# Patient Record
Sex: Male | Born: 1945 | Race: White | Hispanic: No | Marital: Married | Smoking: Never smoker
Health system: Southern US, Community
[De-identification: ages and names within clinical notes are randomized; demographics above are authoritative.]

## PROBLEM LIST (undated history)

## (undated) DIAGNOSIS — K219 Gastro-esophageal reflux disease without esophagitis: Secondary | ICD-10-CM

## (undated) DIAGNOSIS — J189 Pneumonia, unspecified organism: Secondary | ICD-10-CM

## (undated) DIAGNOSIS — E785 Hyperlipidemia, unspecified: Secondary | ICD-10-CM

## (undated) DIAGNOSIS — I10 Essential (primary) hypertension: Secondary | ICD-10-CM

## (undated) DIAGNOSIS — E119 Type 2 diabetes mellitus without complications: Secondary | ICD-10-CM

## (undated) HISTORY — PX: CARDIAC CATHETERIZATION: SHX172

---

## 2000-09-01 ENCOUNTER — Ambulatory Visit (HOSPITAL_COMMUNITY): Admission: RE | Admit: 2000-09-01 | Discharge: 2000-09-01 | Payer: Self-pay | Admitting: Internal Medicine

## 2001-02-23 ENCOUNTER — Encounter: Payer: Self-pay | Admitting: Family Medicine

## 2001-02-23 ENCOUNTER — Ambulatory Visit (HOSPITAL_COMMUNITY): Admission: RE | Admit: 2001-02-23 | Discharge: 2001-02-23 | Payer: Self-pay | Admitting: Family Medicine

## 2001-03-05 ENCOUNTER — Encounter: Admission: RE | Admit: 2001-03-05 | Discharge: 2001-03-05 | Payer: Self-pay | Admitting: Family Medicine

## 2001-03-05 ENCOUNTER — Encounter: Payer: Self-pay | Admitting: Family Medicine

## 2003-08-21 ENCOUNTER — Ambulatory Visit (HOSPITAL_COMMUNITY)
Admission: RE | Admit: 2003-08-21 | Discharge: 2003-08-21 | Payer: Self-pay | Admitting: Rehabilitative and Restorative Service Providers"

## 2004-02-12 ENCOUNTER — Ambulatory Visit (HOSPITAL_COMMUNITY): Admission: RE | Admit: 2004-02-12 | Discharge: 2004-02-12 | Payer: Self-pay | Admitting: *Deleted

## 2005-04-02 ENCOUNTER — Ambulatory Visit (HOSPITAL_COMMUNITY): Admission: RE | Admit: 2005-04-02 | Discharge: 2005-04-02 | Payer: Self-pay | Admitting: *Deleted

## 2007-03-24 ENCOUNTER — Ambulatory Visit (HOSPITAL_COMMUNITY): Admission: RE | Admit: 2007-03-24 | Discharge: 2007-03-24 | Payer: Self-pay | Admitting: *Deleted

## 2008-03-20 HISTORY — PX: DOPPLER ECHOCARDIOGRAPHY: SHX263

## 2008-03-24 ENCOUNTER — Ambulatory Visit (HOSPITAL_COMMUNITY): Admission: RE | Admit: 2008-03-24 | Discharge: 2008-03-24 | Payer: Self-pay | Admitting: Cardiovascular Disease

## 2008-05-16 ENCOUNTER — Ambulatory Visit (HOSPITAL_COMMUNITY): Admission: RE | Admit: 2008-05-16 | Discharge: 2008-05-16 | Payer: Self-pay | Admitting: Family Medicine

## 2009-03-27 ENCOUNTER — Ambulatory Visit (HOSPITAL_COMMUNITY): Admission: RE | Admit: 2009-03-27 | Discharge: 2009-03-27 | Payer: Self-pay | Admitting: Cardiovascular Disease

## 2009-04-13 ENCOUNTER — Ambulatory Visit: Payer: Self-pay | Admitting: Cardiothoracic Surgery

## 2010-03-12 ENCOUNTER — Ambulatory Visit (HOSPITAL_COMMUNITY)
Admission: RE | Admit: 2010-03-12 | Discharge: 2010-03-12 | Payer: Self-pay | Source: Home / Self Care | Attending: Cardiovascular Disease | Admitting: Cardiovascular Disease

## 2010-03-31 ENCOUNTER — Encounter: Payer: Self-pay | Admitting: *Deleted

## 2010-05-02 ENCOUNTER — Encounter (INDEPENDENT_AMBULATORY_CARE_PROVIDER_SITE_OTHER): Payer: BC Managed Care – PPO | Admitting: Cardiothoracic Surgery

## 2010-05-02 DIAGNOSIS — I712 Thoracic aortic aneurysm, without rupture: Secondary | ICD-10-CM

## 2010-05-03 NOTE — Assessment & Plan Note (Signed)
OFFICE VISIT  Brian Gray, Brian Gray DOB:  August 10, 1945                                        May 02, 2010 CHART #:  [id number]  The patient returns to the office today in followup after being seen in February of last year at the request of Dr. Elonda Husky.  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  He had been asymptomatic and has no known aortic valvular disease.  There is no family history of early dissections or aneurysms.  The patient returns today with a followup CT scan 1 year later.  Since last seen, he has been diligent about control of his blood pressure and ranges around 110/75 usually.  He has had no chest pain, exertional shortness of breath, or evidence of congestive heart failure.  PHYSICAL EXAMINATION:  On exam today, his blood pressure 147/94, pulse is 80, respiratory rate is 20, O2 sats 96%, he weighs 200 pounds, 6 feet tall. He has no carotid bruits.  Lungs are clear bilaterally.  Cardiac exam reveals a regular rate and rhythm.  He has no murmur of aortic insufficiency.  On abdominal exam, he has no palpable abdominal aneurysm appreciated.  He has no pedal edema, has 2+ DP and PT pulses bilaterally.  CT scan is reviewed and discussed with the patient and his wife.  He does have a very mildly dilated ascending aorta, maximum dimension 4.3 cm, not changed in appearance.  He does have some scattered coronary calcifications, but the CT scan is not detailed enough to evaluate coronary artery disease.  At this point, the patient has no indication for replacement of his ascending aorta.  I have discussed with him the signs and symptoms of coronary disease.  He continues to now be followed by Dr. Royann Shivers at [facility], since who has seen very little change documented by CT scan over the past year and previous impression that this has been present for  6-7 years.  I have discussed with the patient and followup scan in 2 years rather than 1 year to decrease his radiation dose.  He is agreeable with this.  I have also discussed with him the need to have very diligent blood pressure control.  He continues on the Crestor 20 mg a day, enalapril 10 mg a day, hydrochlorothiazide 12.5 a day, aspirin 81 mg a day, metoprolol 12.5 mg a day, potassium, vitamin B, and cinnamon.  He notes that his blood pressure had been low and one of his medications had recently been decreased.  Overall, appears stable, confirmed on CT scan.  We will plan to see him back in 2 years with a followup CT.  Sheliah Plane, MD Electronically Signed  EG/MEDQ  D:  05/02/2010  T:  05/03/2010  Job:  [id number]  cc:   Mila Homer. Sudie Bailey, M.D. Thurmon Fair, MD

## 2010-05-20 LAB — CREATININE, SERUM: GFR calc non Af Amer: >60 mL/min (ref >60)

## 2010-07-23 NOTE — Consult Note (Signed)
NEW PATIENT CONSULTATION   [name], [name]  DOB:  [birth year]                                        April 13, 2009  CHART #:  [id number]   REQUESTING PHYSICIAN:  Brian Iba, MD   FOLLOWUP CARDIOLOGIST:  Brian Iba, MD   PRIMARY CARE PHYSICIAN:  Brian Homer. Sudie Bailey, MD   REASON FOR CONSULTATION:  Dilated ascending aorta.   HISTORY OF PRESENT ILLNESS:  The patient is a 65 year old male who gives  a history of being followed by Dr. 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  He notes that this has  been known about for 4-5 years.  He recently saw Dr. Mariah [name], who  suggested that he make contact with thoracic surgeon to further discuss  the situation.  The patient is asymptomatic from a cardiac standpoint.  He denies chest pain.  He denies shortness of breath.  He notes that he  did have a stress test 1 year ago.  He has had no syncope, no  presyncope.  There is no family history of early death from aneurysm.  He has had no previous history of myocardial infarction.  Cardiac risk  factors; he does have a history of hypertension, history of  hyperlipidemia, and questionable diabetes.  He has never smoked.  He  denies history of stroke.   PAST MEDICAL HISTORY:  Significant for renal cyst.   PAST SURGICAL HISTORY:  He has had no previous surgery.   SOCIAL HISTORY:  The patient is married, retired from [employer] this  past year.  Does not use alcohol.   FAMILY HISTORY:  Significant for his father died in his 67s of  alcoholism, mother is alive at 104.  He has 4 brothers, one 34 recently  had prostate surgery.  He has 3 brothers, do have a history of diabetes,  coronary stents, and 1 brother had bypass surgery.   CURRENT MEDICATIONS:  1. Hydrochlorothiazide 12.5 mg daily.  2. Enalapril 10 mg daily.  3. Aspirin 81 mg daily.  4. Crestor 20 mg a day.  5. Metoprolol ER 25 daily.  6. B12 1000 mcg a day.  7.  Potassium.  8. Cosamin DS 2 times a week.   DRUG ALLERGIES:  None known.   REVIEW OF SYSTEMS:  CARDIAC REVIEW OF SYSTEMS:  Negative for chest pain,  resting shortness of breath, exertional shortness of breath, orthopnea,  presyncope, syncope, palpitation, or lower extremity edema.  GENERAL REVIEW OF SYSTEMS:  The patient denies fever, chills, or night  sweats.  There is no change in weight.  Denies blood in his stool or  urine.  He did note numbness in his left foot in the past.  It was  treated with B12 and he says this has totally resolved.  No history of  seizures.  No psychiatric history.  No history of polyuria or  polydipsia.  Other review of systems are negative.   PHYSICAL EXAMINATION:  General:  The patient appears his stated age.  Awake, alert, and neurologically intact.  Vital Signs:  His blood  pressure 110/70, pulse 99, respiratory rate is 18, and O2 sats 95%.  He  is 6 feet tall, 190 pounds.  Neck:  He has no carotid bruits.  Lungs:  Clear to auscultation bilaterally without wheezing.  Cardiac:  A regular  rate and rhythm without murmur or gallop.  Abdomen:  Benign without  palpable masses.  Extremities:  Lower extremities with 2+ DP and PT  pulses bilaterally.  Radial pulses are present bilaterally.  Specifically, I do not feel any popliteal aneurysm or abdominal  aneurysm.   CT scan of the chest is reviewed.  The patient has a mildly dilated  ascending aorta approximately 4.2 cm.  This may be a very slight  increase from the 4.1-4.2 in the past 2 years ago.  The patient also has  a large left upper pole renal cyst, that is unchanged.   IMPRESSION:  The patient with mildly dilated ascending aorta without  evidence of aortic valve pathology.  I have reviewed with the patient  and his wife the risks and options of surgical intervention at this time  without valvular heart disease, requiring surgical intervention be more  likely the aortic size of approximately 5.5 to  be considering elective  replacement of his ascending aorta.  I have reviewed with him the  importance of good blood pressure control including with beta-blocker  and I have made a followup appointment for him to see me in 1 year with  a followup CT scan.  I have also reviewed with him the signs and  symptoms of aortic dissection.   Brian Plane, MD  Electronically Signed   EG/MEDQ  D:  04/13/2009  T:  04/14/2009  Job:  [id number]   cc:   Brian Iba, MD  Brian Homer. Sudie [name], M.D.

## 2011-01-16 ENCOUNTER — Encounter (INDEPENDENT_AMBULATORY_CARE_PROVIDER_SITE_OTHER): Payer: Self-pay | Admitting: *Deleted

## 2011-02-04 HISTORY — PX: NM MYOVIEW LTD: HXRAD82

## 2011-02-18 ENCOUNTER — Ambulatory Visit (HOSPITAL_COMMUNITY)
Admission: RE | Admit: 2011-02-18 | Discharge: 2011-02-18 | Disposition: A | Discharge status: Home / Self Care | Payer: Medicare Other | Source: Ambulatory Visit | Attending: Cardiovascular Disease | Admitting: Cardiovascular Disease

## 2011-02-18 ENCOUNTER — Encounter (HOSPITAL_COMMUNITY): Payer: Self-pay

## 2011-02-18 ENCOUNTER — Other Ambulatory Visit (HOSPITAL_COMMUNITY): Payer: Self-pay | Admitting: Cardiovascular Disease

## 2011-02-18 DIAGNOSIS — Z01811 Encounter for preprocedural respiratory examination: Secondary | ICD-10-CM

## 2011-02-18 DIAGNOSIS — Z01818 Encounter for other preprocedural examination: Secondary | ICD-10-CM | POA: Insufficient documentation

## 2011-02-18 DIAGNOSIS — R079 Chest pain, unspecified: Secondary | ICD-10-CM | POA: Insufficient documentation

## 2011-02-20 ENCOUNTER — Other Ambulatory Visit: Payer: Self-pay | Admitting: Cardiovascular Disease

## 2011-02-21 ENCOUNTER — Encounter (HOSPITAL_COMMUNITY)
Admission: RE | Disposition: A | Discharge status: Home / Self Care | Payer: Self-pay | Source: Ambulatory Visit | Attending: Cardiovascular Disease

## 2011-02-21 ENCOUNTER — Other Ambulatory Visit: Payer: Self-pay

## 2011-02-21 ENCOUNTER — Ambulatory Visit (HOSPITAL_COMMUNITY)
Admission: RE | Admit: 2011-02-21 | Discharge: 2011-02-21 | Disposition: A | Discharge status: Home / Self Care | Payer: Medicare Other | Source: Ambulatory Visit | Attending: Cardiovascular Disease | Admitting: Cardiovascular Disease

## 2011-02-21 DIAGNOSIS — R079 Chest pain, unspecified: Secondary | ICD-10-CM | POA: Insufficient documentation

## 2011-02-21 HISTORY — PX: CARDIAC CATHETERIZATION: SHX172

## 2011-02-21 HISTORY — PX: LEFT HEART CATHETERIZATION WITH CORONARY ANGIOGRAM: SHX5451

## 2011-02-21 SURGERY — LEFT HEART CATHETERIZATION WITH CORONARY ANGIOGRAM
Anesthesia: LOCAL

## 2011-02-21 MED ORDER — FENTANYL CITRATE 0.05 MG/ML IJ SOLN
INTRAMUSCULAR | Status: AC
  Filled 2011-02-21: qty 2

## 2011-02-21 MED ORDER — SODIUM CHLORIDE 0.9 % IJ SOLN: 3.0000 mL | INTRAMUSCULAR | Status: DC | PRN

## 2011-02-21 MED ORDER — SODIUM CHLORIDE 0.9 % IV SOLN: INTRAVENOUS | Status: DC

## 2011-02-21 MED ORDER — LIDOCAINE HCL (PF) 1 % IJ SOLN
INTRAMUSCULAR | Status: AC
  Filled 2011-02-21: qty 30

## 2011-02-21 MED ORDER — HEPARIN (PORCINE) IN NACL 2-0.9 UNIT/ML-% IJ SOLN
INTRAMUSCULAR | Status: AC
  Filled 2011-02-21: qty 2000

## 2011-02-21 MED ORDER — VERAPAMIL HCL 2.5 MG/ML IV SOLN
INTRAVENOUS | Status: AC
  Filled 2011-02-21: qty 2

## 2011-02-21 MED ORDER — OXYCODONE-ACETAMINOPHEN 5-325 MG PO TABS: 1.0000 | ORAL_TABLET | ORAL | Status: DC | PRN

## 2011-02-21 MED ORDER — MIDAZOLAM HCL 2 MG/2ML IJ SOLN
INTRAMUSCULAR | Status: AC
  Filled 2011-02-21: qty 2

## 2011-02-21 MED ORDER — SODIUM CHLORIDE 0.9 % IV SOLN: 1.0000 mL/kg/h | INTRAVENOUS | Status: DC

## 2011-02-21 MED ORDER — ONDANSETRON HCL 4 MG/2ML IJ SOLN: 4.0000 mg | Freq: Four times a day (QID) | INTRAMUSCULAR | Status: DC | PRN

## 2011-02-21 MED ORDER — ACETAMINOPHEN 325 MG PO TABS: 650.0000 mg | ORAL_TABLET | ORAL | Status: DC | PRN

## 2011-02-21 MED ORDER — NITROGLYCERIN 0.2 MG/ML ON CALL CATH LAB
INTRAVENOUS | Status: AC
  Filled 2011-02-21: qty 1

## 2011-02-21 MED ORDER — HEPARIN SODIUM (PORCINE) 1000 UNIT/ML IJ SOLN
INTRAMUSCULAR | Status: AC
  Filled 2011-02-21: qty 1

## 2011-02-21 NOTE — Progress Notes (Signed)
Explained D/C instructions to pt and wife of pt.  They both verbalized understanding of D/C instructions. Pt D/C home with wife via wheelchair

## 2011-02-21 NOTE — H&P (Signed)
Date of Initial H&P: 02/18/11 History reviewed, patient examined, no change in status, stable for surgery.

## 2011-02-21 NOTE — Op Note (Signed)
CARDIAC CATHETERIZATION REPORT   Procedures performed:  1. Left heart catheterization  2. Selective coronary angiography    Reason for procedure:   Abnormal nuclear stress test Unexplained atypical chest pain  Procedure performed by: Thurmon Fair, MD, [facility]  Complications: none   Estimated blood loss: less than 5 mL   History:  Atypical chest pain. Abnormal nuclear scan suggests inferior ischemia.  Consent: The risks, benefits, and details of the procedure were explained to the patient. Risks including death, MI, stroke, bleeding, limb ischemia, renal failure and allergy were described and accepted by the patient. Informed written consent was obtained prior to proceeding.  Technique: The patient was brought to the cardiac catheterization laboratory in the fasting state. He was prepped and draped in the usual sterile fashion. Local anesthesia with 1% lidocaine was administered to the right wrist area. Using the modified Seldinger technique a 5 French right radial artery sheath was introduced without difficulty. Under fluoroscopic guidance, using 5 [place] TIG catheter, selective cannulation of the left coronary artery, right coronary artery and left ventricle were respectively performed. Several coronary angiograms in a variety of projections were recorded. Left ventricular pressure and a pull back to the aorta were recorded. No immediate complications occurred. At the end of the procedure, the catheter were removed. After the procedure, hemostasis will be achieved with radial band pressure.  Contrast used: 65 mL Omnipaque  Angiographic Findings:  1. The left main coronary artery is free of significant atherosclerosis and bifurcates in the usual fashion into the left anterior descending artery and left circumflex coronary artery.  2. The left anterior descending artery is a large vessel that reaches the apex and generates 1 major diagonal branch. There is evidence of no luminal  irregularities and no calcification. No hemodynamically meaningful stenoses are seen. 3. The left circumflex coronary artery is a medium-size vessel non dominant vessel that generates 2 major oblque marginal arteries (one is very proximal, like a ramus intermedius). There is evidence of no luminal irregularities and no calcification. No hemodynamically meaningful stenoses are seen. 4. The right coronary artery is a large-size dominant vessel that generates a large posterior lateral ventricular system as well as the PDA. There is evidence of no luminal irregularities and minimal proximal calcification. No hemodynamically meaningful stenoses are seen.  5. The left ventricle was not injected. The left ventricular end-diastolic pressure is 2 mm Hg. There is no aortic stenosis by pullback.    IMPRESSIONS:  Normal coronary arteries. False positive nuclear stress test. RECOMMENDATION:  Evaluate for non cardiac cause of symptoms.

## 2011-03-13 ENCOUNTER — Encounter (INDEPENDENT_AMBULATORY_CARE_PROVIDER_SITE_OTHER): Payer: Self-pay | Admitting: *Deleted

## 2011-03-26 ENCOUNTER — Telehealth (INDEPENDENT_AMBULATORY_CARE_PROVIDER_SITE_OTHER): Payer: Self-pay | Admitting: *Deleted

## 2011-03-26 ENCOUNTER — Other Ambulatory Visit (INDEPENDENT_AMBULATORY_CARE_PROVIDER_SITE_OTHER): Payer: Self-pay | Admitting: *Deleted

## 2011-03-26 DIAGNOSIS — Z1211 Encounter for screening for malignant neoplasm of colon: Secondary | ICD-10-CM

## 2011-03-26 MED ORDER — PEG-KCL-NACL-NASULF-NA ASC-C 100 G PO SOLR: 1.0000 | Freq: Once | ORAL | Status: DC

## 2011-03-26 NOTE — Telephone Encounter (Signed)
Patient needs movi prep 

## 2011-04-22 ENCOUNTER — Telehealth (INDEPENDENT_AMBULATORY_CARE_PROVIDER_SITE_OTHER): Payer: Self-pay | Admitting: *Deleted

## 2011-04-22 NOTE — Telephone Encounter (Signed)
agree

## 2011-04-22 NOTE — Telephone Encounter (Signed)
Requesting MD/PCP:  [facility]     Name & DOB: Brian Gray     Procedure: tcs  Reason/Indication:  screening  Has patient had this procedure before?  yes  If so, when, by whom and where?  10-11 yrs ago  Is there a family history of colon cancer?  no  Who?  What age when diagnosed?    Is patient diabetic?   no      Does patient have prosthetic heart valve?  no  Do you have a pacemaker?  no  Has patient had joint replacement within last 12 months?  no  Is patient on Coumadin, Plavix and/or Aspirin? yes  Medications: asa 81 mg daily, hctz 12.5 mg daily, enalapril 10 mg daily,, b-12 1000 mg daily, cinnamon 1000 mg daily, crestor 20 mg daily, metoprolol 12.5 mg daily  Allergies: nkda  Pharmacy: [facility]  Medication Adjustment: asa 2 days  Procedure date & time: 05/15/11 at 930

## 2011-05-13 ENCOUNTER — Encounter (HOSPITAL_COMMUNITY): Payer: Self-pay | Admitting: Pharmacy Technician

## 2011-05-14 MED ORDER — SODIUM CHLORIDE 0.45 % IV SOLN
Freq: Once | INTRAVENOUS | Status: AC
  Administered 2011-05-15: 09:00:00 via INTRAVENOUS

## 2011-05-15 ENCOUNTER — Encounter (HOSPITAL_COMMUNITY)
Admission: RE | Disposition: A | Discharge status: Home Health | Payer: Self-pay | Source: Ambulatory Visit | Attending: Internal Medicine

## 2011-05-15 ENCOUNTER — Ambulatory Visit (HOSPITAL_COMMUNITY)
Admission: RE | Admit: 2011-05-15 | Discharge: 2011-05-15 | Disposition: A | Discharge status: Home Health | Payer: Medicare Other | Source: Ambulatory Visit | Attending: Internal Medicine | Admitting: Internal Medicine

## 2011-05-15 ENCOUNTER — Encounter (HOSPITAL_COMMUNITY): Payer: Self-pay | Admitting: *Deleted

## 2011-05-15 DIAGNOSIS — I10 Essential (primary) hypertension: Secondary | ICD-10-CM | POA: Insufficient documentation

## 2011-05-15 DIAGNOSIS — Z79899 Other long term (current) drug therapy: Secondary | ICD-10-CM | POA: Insufficient documentation

## 2011-05-15 DIAGNOSIS — K5730 Diverticulosis of large intestine without perforation or abscess without bleeding: Secondary | ICD-10-CM

## 2011-05-15 DIAGNOSIS — Z1211 Encounter for screening for malignant neoplasm of colon: Secondary | ICD-10-CM | POA: Insufficient documentation

## 2011-05-15 DIAGNOSIS — K644 Residual hemorrhoidal skin tags: Secondary | ICD-10-CM

## 2011-05-15 DIAGNOSIS — E785 Hyperlipidemia, unspecified: Secondary | ICD-10-CM | POA: Insufficient documentation

## 2011-05-15 HISTORY — DX: Hyperlipidemia, unspecified: E78.5

## 2011-05-15 HISTORY — PX: COLONOSCOPY: SHX5424

## 2011-05-15 HISTORY — DX: Essential (primary) hypertension: I10

## 2011-05-15 SURGERY — COLONOSCOPY
Anesthesia: Moderate Sedation

## 2011-05-15 MED ORDER — MIDAZOLAM HCL 5 MG/5ML IJ SOLN
INTRAMUSCULAR | Status: DC | PRN
  Administered 2011-05-15: 2 mg via INTRAVENOUS
  Administered 2011-05-15: 1 mg via INTRAVENOUS
  Administered 2011-05-15: 2 mg via INTRAVENOUS

## 2011-05-15 MED ORDER — STERILE WATER FOR IRRIGATION IR SOLN
Status: DC | PRN
  Administered 2011-05-15: 10:00:00

## 2011-05-15 MED ORDER — MEPERIDINE HCL 50 MG/ML IJ SOLN
INTRAMUSCULAR | Status: AC
  Filled 2011-05-15: qty 1

## 2011-05-15 MED ORDER — MIDAZOLAM HCL 5 MG/5ML IJ SOLN
INTRAMUSCULAR | Status: AC
  Filled 2011-05-15: qty 10

## 2011-05-15 MED ORDER — MEPERIDINE HCL 50 MG/ML IJ SOLN
INTRAMUSCULAR | Status: DC | PRN
  Administered 2011-05-15 (×2): 25 mg via INTRAVENOUS

## 2011-05-15 NOTE — Discharge Instructions (Addendum)
Resume usual medications. Take Naprosyn only when you really need it. High-fiber diet. No driving for [id number]. Next screening exam in 10 years.  Colonoscopy Care After These instructions give you information on caring for yourself after your procedure. Your doctor may also give you more specific instructions. Call your doctor if you have any problems or questions after your procedure. HOME CARE  Take it easy for the next 24 hours.   Rest.   Walk or use warm packs on your belly (abdomen) if you have belly cramping or gas.   Do not drive for 24 hours.   You may shower.   Do not sign important papers or use machinery for 24 hours.   Drink enough fluids to keep your pee (urine) clear or pale yellow.   Resume your normal diet. Avoid heavy or fried foods.   Avoid alcohol.   Continue taking your normal medicines.   Only take medicine as told by your doctor. Do not take aspirin.  If you had growths (polyps) removed:  Do not take aspirin.   Do not drink alcohol for 7 days or as told by your doctor.   Eat a soft diet for 24 hours.  GET HELP RIGHT AWAY IF:  You have a fever.   You pass clumps of tissue (blood clots) or fill the toilet with blood.   You have belly pain that gets worse and medicine does not help.   Your belly is puffy (swollen).   You feel sick to your stomach (nauseous) or throw up (vomit).  MAKE SURE YOU:  Understand these instructions.   Will watch your condition.   Will get help right away if you are not doing well or get worse.  Document Released: 03/29/2010 Document Revised: 02/13/2011 Document Reviewed: 03/29/2010 [facility] Patient Information 2012 [place], [place].  Diverticulosis Diverticulosis is a common condition that develops when small pouches (diverticula) form in the wall of the colon. The risk of diverticulosis increases with age. It happens more often in people who eat a low-fiber diet. Most individuals with diverticulosis have no  symptoms. Those individuals with symptoms usually experience abdominal pain, constipation, or loose stools (diarrhea). HOME CARE INSTRUCTIONS   Increase the amount of fiber in your diet as directed by your caregiver or dietician. This may reduce symptoms of diverticulosis.   Your caregiver may recommend taking a dietary fiber supplement.   Drink at least 6 to 8 glasses of water each day to prevent constipation.   Try not to strain when you have a bowel movement.   Your caregiver may recommend avoiding nuts and seeds to prevent complications, although this is still an uncertain benefit.   Only take over-the-counter or prescription medicines for pain, discomfort, or fever as directed by your caregiver.  FOODS WITH HIGH FIBER CONTENT INCLUDE:  Fruits. Apple, peach, pear, tangerine, raisins, prunes.   Vegetables. Brussels sprouts, asparagus, broccoli, cabbage, carrot, cauliflower, romaine lettuce, spinach, summer squash, tomato, winter squash, zucchini.   Starchy Vegetables. Baked beans, kidney beans, lima beans, split peas, lentils, potatoes (with skin).   Grains. Whole wheat bread, brown rice, bran flake cereal, plain oatmeal, white rice, shredded wheat, bran muffins.  SEEK IMMEDIATE MEDICAL CARE IF:   You develop increasing pain or severe bloating.   You have an oral temperature above 102 F (38.9 C), not controlled by medicine.   You develop vomiting or bowel movements that are bloody or black.  Document Released: 11/22/2003 Document Revised: 02/13/2011 Document Reviewed: 07/25/2009 [employer] Patient Information 2012  [employer].  High Fiber Diet A high fiber diet changes your normal diet to include more whole grains, legumes, fruits, and vegetables. Changes in the diet involve replacing refined carbohydrates with unrefined foods. The calorie level of the diet is essentially unchanged. The Dietary Reference Intake (recommended amount) for adult males is 38 g per day. For adult  females, it is 25 g per day. Pregnant and lactating women should consume 28 g of fiber per day. Fiber is the intact part of a plant that is not broken down during digestion. Functional fiber is fiber that has been isolated from the plant to provide a beneficial effect in the body. PURPOSE  Increase stool bulk.   Ease and regulate bowel movements.   Lower cholesterol.  INDICATIONS THAT YOU NEED MORE FIBER  Constipation and hemorrhoids.   Uncomplicated diverticulosis (intestine condition) and irritable bowel syndrome.   Weight management.   As a protective measure against hardening of the arteries (atherosclerosis), diabetes, and cancer.  NOTE OF CAUTION If you have a digestive or bowel problem, ask your caregiver for advice before adding high fiber foods to your diet. Some of the following medical problems are such that a high fiber diet should not be used without consulting your caregiver:  Acute diverticulitis (intestine infection).   Partial small bowel obstructions.   Complicated diverticular disease involving bleeding, rupture (perforation), or abscess (boil, furuncle).   Presence of autonomic neuropathy (nerve damage) or gastric paresis (stomach cannot empty itself).  GUIDELINES FOR INCREASING FIBER  Start adding fiber to the diet slowly. A gradual increase of about 5 more grams (2 slices of whole-wheat bread, 2 servings of most fruits or vegetables, or 1 bowl of high fiber cereal) per day is best. Too rapid an increase in fiber may result in constipation, flatulence, and bloating.   Drink enough water and fluids to keep your urine clear or pale yellow. Water, juice, or caffeine-free drinks are recommended. Not drinking enough fluid may cause constipation.   Eat a variety of high fiber foods rather than one type of fiber.   Try to increase your intake of fiber through using high fiber foods rather than fiber pills or supplements that contain small amounts of fiber.   The  goal is to change the types of food eaten. Do not supplement your present diet with high fiber foods, but replace foods in your present diet.  INCLUDE A VARIETY OF FIBER SOURCES  Replace refined and processed grains with whole grains, canned fruits with fresh fruits, and incorporate other fiber sources. White rice, white breads, and most bakery goods contain little or no fiber.   Brown whole-grain rice, buckwheat oats, and many fruits and vegetables are all good sources of fiber. These include: broccoli, Brussels sprouts, cabbage, cauliflower, beets, sweet potatoes, white potatoes (skin on), carrots, tomatoes, eggplant, squash, berries, fresh fruits, and dried fruits.   Cereals appear to be the richest source of fiber. Cereal fiber is found in whole grains and bran. Bran is the fiber-rich outer coat of cereal grain, which is largely removed in refining. In whole-grain cereals, the bran remains. In breakfast cereals, the largest amount of fiber is found in those with "bran" in their names. The fiber content is sometimes indicated on the label.   You may need to include additional fruits and vegetables each day.   In baking, for 1 cup white flour, you may use the following substitutions:   1 cup whole-wheat flour minus 2 tbs.    cup  white flour plus  cup whole-wheat flour.  Document Released: 02/24/2005 Document Revised: 02/13/2011 Document Reviewed: 01/02/2009 [facility] Patient Information 2012 [place], [place].

## 2011-05-15 NOTE — H&P (Signed)
Brian Gray is an 66 y.o. male.   Chief Complaint: Patient is here for colonoscopy. HPI: Patient is 66 year old Caucasian man who is here for a screening colonoscopy. His last exam was over 10 years ago. He denies abdominal pain change in his bowel habits or rectal bleeding. Family history significant for colorectal carcinoma.  Past Medical History  Diagnosis Date  . Hypertension   . Hyperlipidemia     Past Surgical History  Procedure Date  . Cardiac catheterization     Family History  Problem Relation Age of Onset  . Colon cancer Neg Hx    Social History:  reports that he has never smoked. He does not have any smokeless tobacco history on file. He reports that he does not drink alcohol or use illicit drugs.  Allergies: No Known Allergies  Medications Prior to Admission  Medication Dose Route Frequency Provider Last Rate Last Dose  . 0.45 % sodium chloride infusion   Intravenous Once Malissa Hippo, MD 20 mL/hr at 05/15/11 0858    . meperidine (DEMEROL) 50 MG/ML injection           . midazolam (VERSED) 5 MG/5ML injection            Medications Prior to Admission  Medication Sig Dispense Refill  . CINNAMON PO Take 1,000 mg by mouth daily.       . enalapril (VASOTEC) 10 MG tablet Take 10 mg by mouth daily.        . hydrochlorothiazide (HYDRODIURIL) 12.5 MG tablet Take 12.5 mg by mouth daily.        . metoprolol tartrate (LOPRESSOR) 25 MG tablet Take 12.5 mg by mouth daily.        . naproxen sodium (ANAPROX) 220 MG tablet Take 220 mg by mouth every 8 (eight) hours as needed. For pain      . aspirin EC 81 MG tablet Take 81 mg by mouth daily.        . rosuvastatin (CRESTOR) 20 MG tablet Take 20 mg by mouth daily.        . vitamin B-12 (CYANOCOBALAMIN) 1000 MCG tablet Take 1,000 mcg by mouth daily.          No results found for this or any previous visit (from the past 48 hour(s)). No results found.  Review of Systems  Constitutional: Negative for weight loss.    Gastrointestinal: Negative for abdominal pain, diarrhea, constipation, blood in stool and melena.    Blood pressure 150/85, pulse 77, temperature 97.7 F (36.5 C), temperature source Oral, resp. rate 18, height 6' (1.829 m), weight 198 lb (89.812 kg), SpO2 98.00%. Physical Exam  Constitutional: He appears well-developed and well-nourished.  HENT:  Mouth/Throat: Oropharynx is clear and moist.  Eyes: Conjunctivae are normal. No scleral icterus.  Neck: No thyromegaly present.  Cardiovascular: Normal rate, regular rhythm and normal heart sounds.   No murmur heard. Respiratory: Effort normal and breath sounds normal.  GI: Soft. There is no tenderness.  Musculoskeletal: He exhibits no edema.  Lymphadenopathy:    He has no cervical adenopathy.  Neurological: He is alert.  Skin: Skin is warm.     Assessment/Plan Average risk screening colonoscopy.  [name] 05/15/2011, 9:27 AM

## 2011-05-15 NOTE — Op Note (Signed)
COLONOSCOPY PROCEDURE REPORT  PATIENT:  Brian Gray  MR#:  [id number] Birthdate:  05/28/1945, 66 y.o., male Endoscopist:  Dr. Malissa Hippo, MD Referred By:  Dr. Mila Homer. Sudie Bailey, MD Procedure Date: 05/15/2011  Procedure:   Colonoscopy  Indications: Patient is a 66 year old Caucasian male who is undergoing at risk screening colonoscopy.  Informed Consent:  Procedure and risks with the patient and informed consent was obtained.  Medications:  Demerol 50 mg IV Versed 5 mg IV  Description of procedure:  After a digital rectal exam was performed, that colonoscope was advanced from the anus through the rectum and colon to the area of the cecum, ileocecal valve and appendiceal orifice. The cecum was deeply intubated. These structures were well-seen and photographed for the record. From the level of the cecum and ileocecal valve, the scope was slowly and cautiously withdrawn. The mucosal surfaces were carefully surveyed utilizing scope tip to flexion to facilitate fold flattening as needed. The scope was pulled down into the rectum where a thorough exam including retroflexion was performed.  Findings:   Prep excellent. Scattered diverticula throughout the colon but most of these are located at sigmoid colon. No polyps or other mucosal abnormalities noted. Normal rectal mucosa. Small hemorrhoids below the dentate line.  Therapeutic/Diagnostic Maneuvers Performed:  None  Complications:  None  Cecal Withdrawal Time:  8 minutes  Impression:  Examination performed to cecum. Pancolonic diverticulosis. Small external hemorrhoids.  Recommendations:  Standard instructions given. Next screening exam in 10 years.  [name]  05/15/2011 9:59 AM  CC: Dr. Milana Obey, MD, MD & Dr. Bonnetta Barry ref. provider found

## 2011-05-22 ENCOUNTER — Encounter (HOSPITAL_COMMUNITY): Payer: Self-pay | Admitting: Internal Medicine

## 2011-07-21 ENCOUNTER — Encounter (INDEPENDENT_AMBULATORY_CARE_PROVIDER_SITE_OTHER): Payer: Self-pay

## 2012-03-11 ENCOUNTER — Other Ambulatory Visit (HOSPITAL_COMMUNITY): Payer: Self-pay | Admitting: Surgery

## 2012-03-11 DIAGNOSIS — I729 Aneurysm of unspecified site: Secondary | ICD-10-CM

## 2012-03-16 ENCOUNTER — Ambulatory Visit (HOSPITAL_COMMUNITY): Admission: RE | Admit: 2012-03-16 | Payer: Medicare Other | Source: Ambulatory Visit

## 2012-03-23 ENCOUNTER — Encounter (HOSPITAL_COMMUNITY): Payer: Self-pay

## 2012-03-23 ENCOUNTER — Ambulatory Visit (HOSPITAL_COMMUNITY)
Admission: RE | Admit: 2012-03-23 | Discharge: 2012-03-23 | Disposition: A | Discharge status: Home / Self Care | Payer: Medicare Other | Source: Ambulatory Visit | Attending: Surgery | Admitting: Surgery

## 2012-03-23 DIAGNOSIS — I712 Thoracic aortic aneurysm, without rupture, unspecified: Secondary | ICD-10-CM | POA: Insufficient documentation

## 2012-03-23 DIAGNOSIS — I729 Aneurysm of unspecified site: Secondary | ICD-10-CM

## 2012-03-23 MED ORDER — IOHEXOL 350 MG/ML SOLN
100.0000 mL | Freq: Once | INTRAVENOUS | Status: AC | PRN
  Administered 2012-03-23: 100 mL via INTRAVENOUS

## 2012-04-29 ENCOUNTER — Encounter: Payer: Self-pay | Admitting: Cardiothoracic Surgery

## 2012-04-29 ENCOUNTER — Ambulatory Visit (INDEPENDENT_AMBULATORY_CARE_PROVIDER_SITE_OTHER): Payer: Medicare Other | Admitting: Cardiothoracic Surgery

## 2012-04-29 VITALS — BP 141/96 | HR 90 | Resp 16 | Ht 72.0 in | Wt 194.0 lb

## 2012-04-29 DIAGNOSIS — G63 Polyneuropathy in diseases classified elsewhere: Secondary | ICD-10-CM

## 2012-04-29 DIAGNOSIS — I77810 Thoracic aortic ectasia: Secondary | ICD-10-CM

## 2012-04-29 DIAGNOSIS — I712 Thoracic aortic aneurysm, without rupture, unspecified: Secondary | ICD-10-CM

## 2012-04-29 DIAGNOSIS — E538 Deficiency of other specified B group vitamins: Secondary | ICD-10-CM

## 2012-04-29 DIAGNOSIS — I10 Essential (primary) hypertension: Secondary | ICD-10-CM

## 2012-04-29 NOTE — Progress Notes (Signed)
[address]            [place] [address]          [phone]      Brian Gray [facility] Medical Record #[id number] Date of Birth: [birth year]  Referring: Antonieta Iba, MD Primary Care: Milana Obey, MD  Chief Complaint:    Chief Complaint  Patient presents with  . TAA    2 yr f/u with CTA CHEST    History of Present Illness:    Patient returns today with a recent CT scan of the chest. Originally saw him in February 2011 for a dilated descending aorta at that time there was approximately 4-4.36 cm in size and had been known about for 4-5 years. Since I last saw him he had a cardiac catheterization that showed no significant stenosis. This was as a result of a faults positive stress test.  The patient denies significant shortness of breath dyspnea on exertion or chest discomfort. He has no family history of abnormal aorta aortic aneurysms or sudden death from unknown cause.  Current Activity/ Functional Status:  Patient is independent with mobility/ambulation, transfers, ADL's, IADL's.  Zubrod Score: At the time of surgery this patient's most appropriate activity status/level should be described as: [x]  Normal activity, no symptoms []  Symptoms, fully ambulatory []  Symptoms, in bed less than or equal to 50% of the time []  Symptoms, in bed greater than 50% of the time but less than 100% []  Bedridden []  Moribund   Past Medical History  Diagnosis Date  . Hypertension   . Hyperlipidemia     Past Surgical History  Procedure Laterality Date  . Cardiac catheterization    . Colonoscopy  05/15/2011    Procedure: COLONOSCOPY;  Surgeon: Malissa Hippo, MD;  [facility];  Service: Endoscopy;  Laterality: N/A;  930    Family History  Problem Relation Age of Onset  . Colon cancer Neg Hx   father died ruptured stomach at age 8 ulcer , three brothers have has heart surgery no history dissection  History   Social  History  . Marital Status: Married    Spouse Name: N/A    Number of Children: N/A  . Years of Education: N/A   Occupational History  . Not on file.   Social History Main Topics  . Smoking status: Never Smoker   . Smokeless tobacco: Not on file  . Alcohol Use: No  . Drug Use: No  . Sexually Active:    Other Topics Concern  . Not on file   Social History Narrative  . No narrative on file    History  Smoking status  . Never Smoker   Smokeless tobacco  . Not on file    History  Alcohol Use No     No Known Allergies  Current Outpatient Prescriptions  Medication Sig Dispense Refill  . aspirin EC 81 MG tablet Take 81 mg by mouth daily.        Marland Kitchen CINNAMON PO Take 1,000 mg by mouth daily.       . hydrochlorothiazide (HYDRODIURIL) 12.5 MG tablet Take 12.5 mg by mouth daily.        Marland Kitchen lisinopril (PRINIVIL,ZESTRIL) 10 MG tablet Take 10 mg by mouth daily.      . metoprolol tartrate (LOPRESSOR) 25 MG tablet Take 12.5 mg by mouth daily.        Marland Kitchen  naproxen sodium (ANAPROX) 220 MG tablet Take 220 mg by mouth every 8 (eight) hours as needed. For pain      . rosuvastatin (CRESTOR) 20 MG tablet Take 20 mg by mouth daily.        . vitamin B-12 (CYANOCOBALAMIN) 1000 MCG tablet Take 1,000 mcg by mouth daily.         No current facility-administered medications for this visit.       Review of Systems:     Cardiac Review of Systems: Y or N  Chest Pain [   n ]  Resting SOB [  n ] Exertional SOB  [ n]  Orthopnea [  ]   Pedal Edema [  n ]    Palpitations [ n ] Syncope  [ n ]   Presyncope [ n  ]  General Review of Systems: [Y] = yes [  ]=no Constitional: recent weight change [  ]; anorexia [  ]; fatigue [ n ]; nausea [  ]; night sweats [  ]; fever [  ]; or chills [  ];                                                                                                                                          Dental: poor dentition[n  ]; Last Dentist visit: twice a year  Eye : blurred vision [   ]; diplopia [   ]; vision changes [  ];  Amaurosis fugax[  ]; Resp: cough [  ];  wheezing[  ];  hemoptysis[  ]; shortness of breath[  ]; paroxysmal nocturnal dyspnea[  ]; dyspnea on exertion[  ]; or orthopnea[  ];  GI:  gallstones[  ], vomiting[  ];  dysphagia[  ]; melena[  ];  hematochezia [  ]; heartburn[  ];   Hx of  Colonoscopy[ y ]; GU: kidney stones [  ]; hematuria[  ];   dysuria [  ];  nocturia[  ];  history of     obstruction [  ]; urinary frequency [  ]             Skin: rash, swelling[  ];, hair loss[  ];  peripheral edema[  ];  or itching[  ]; Musculosketetal: myalgias[  ];  joint swelling[  ];  joint erythema[  ];  joint pain[  ];  back pain[  ];  Heme/Lymph: bruising[  ];  bleeding[  ];  anemia[  ];  Neuro: TIA[  ];  headaches[  ];  stroke[  ];  vertigo[  ];  seizures[  ];   paresthesias[  ];  difficulty walking[  ];  Psych:depression[  ]; anxiety[  ];  Endocrine: diabetes[  ];  thyroid dysfunction[  ];  Immunizations: Flu Cove.Etienne  ]; Pneumococcal[n  ];  Other:  Physical Exam: BP 141/96  Pulse 90  Resp 16  Ht 6' (1.829  m)  Wt 194 lb (87.998 kg)  BMI 26.31 kg/m2  SpO2 97%  General appearance: alert, cooperative, appears stated age and no distress Neurologic: intact Heart: regular rate and rhythm, S1, S2 normal, no murmur, click, rub or gallop and normal apical impulse Lungs: clear to auscultation bilaterally Abdomen: soft, non-tender; bowel sounds normal; no masses,  no organomegaly Extremities: extremities normal, atraumatic, no cyanosis or edema and Homans sign is negative, no sign of DVT   Diagnostic Studies & Laboratory data:     Recent Radiology Findings:  RADIOLOGY REPORT*  Clinical Data: Evaluate ascending aortic aneurysm  CT ANGIOGRAPHY CHEST  Technique: Multidetector CT imaging of the chest using the  standard protocol during bolus administration of intravenous  contrast. Multiplanar reconstructed images including MIPs were  obtained and reviewed to evaluate  the vascular anatomy.  Contrast: OMNIPAQUE IOHEXOL 350 MG/ML SOLN  Comparison: Chest CT - 03/12/2010; 03/24/2008  Vascular Findings:  There is grossly unchanged fusiform ectasia of the ascending  thoracic aorta with measurements as follows. The aorta tapers to a  normal caliber at the level of the aortic arch. 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 Normal caliber of the  descending thoracic aorta. No evidence of thoracic aortic  dissection. No periaortic stranding. Minimal calcifications within  the aortic valve annulus.  Sinotubular junction: 36 mm as measured in greatest oblique coronal  dimension, unchanged.  Proximal ascending aorta: 42 x 44 mm as measured in greatest  oblique axial dimension at the level of the main pulmonary artery.  The ascending thoracic aorta measures approximately 44 mm in  greatest oblique coronal dimension. These measurements are grossly  unchanged since the 03/2008 examination  Aortic arch aorta: 26 mm as measured in greatest oblique sagittal  dimension.  Proximal descending thoracic aorta: 32 x 30 mm as measured in  greatest oblique axial dimension at the level of the main pulmonary  artery, unchanged.  Distal descending thoracic aorta: 26 x 25 mm as measured in  greatest oblique axial dimension at the level of the diaphragmatic  hiatus, unchanged.  Borderline cardiomegaly. Minimal coronary artery calcifications.  No pericardial effusion. Although this examination was not  tailored for evaluation of the pulmonary arteries, there are no  discrete filling defects within the central pulmonary arterial tree  to suggest pulmonary embolism. Normal caliber of the main  pulmonary artery.  ----------------------------------------------  Nonvascular findings:  Minimal dependent subpleural ground-glass atelectasis. No focal  airspace opacities. No pleural effusion or pneumothorax. The  central pulmonary airways are patent. The punctate  granuloma in  the right middle lobe (image 43, series six). No mediastinal,  hilar or axillary lymphadenopathy.  Early arterial phase evaluation of the upper abdomen is  unremarkable.  No acute or aggressive osseous abnormalities.  IMPRESSION:  Unchanged fusiform ectasia of the ascending thoracic aorta  measuring approximately 44 mm in greatest oblique axial dimension,  grossly unchanged since the 03/2008 examination.  Original Report Authenticated By: Tacey Ruiz, MD   Recent Lab Findings: Lab Results  Component Value Date   CREATININE 1.11 03/12/2010      Assessment / Plan:     Patient is asymptomatic with stable appearing dilatation of the ascending aorta less than 4.5 cm in size, on exam he has no concomitant aortic valve disease. I recommended that we continue with good blood pressure control including beta blocker. I'll plan to see him back again in 2 years with a followup CT scan of the chest.  Delight Ovens MD  Beeper [phone] Office [phone] 04/29/2012 12:31 PM

## 2013-01-19 ENCOUNTER — Encounter: Payer: Self-pay | Admitting: Cardiovascular Disease

## 2013-01-19 ENCOUNTER — Ambulatory Visit (INDEPENDENT_AMBULATORY_CARE_PROVIDER_SITE_OTHER): Payer: Medicare Other | Admitting: Cardiovascular Disease

## 2013-01-19 VITALS — BP 126/78 | HR 73 | Resp 16 | Ht 72.0 in | Wt 201.5 lb

## 2013-01-19 DIAGNOSIS — I10 Essential (primary) hypertension: Secondary | ICD-10-CM

## 2013-01-19 DIAGNOSIS — I77810 Thoracic aortic ectasia: Secondary | ICD-10-CM

## 2013-01-19 DIAGNOSIS — E785 Hyperlipidemia, unspecified: Secondary | ICD-10-CM

## 2013-01-19 NOTE — Patient Instructions (Signed)
Your physician recommends that you schedule a follow-up appointment in: 1 year  

## 2013-01-19 NOTE — Assessment & Plan Note (Signed)
Well treated. Beta blockers are a preferred ingredients in his antihypertensive cocktail.

## 2013-01-19 NOTE — Assessment & Plan Note (Signed)
On statin therapy. We'll get results from Dr. Michelle Nasuti office. He also has diabetes mellitus that comes and goes depending on his diet and weight. He had reduced his A1c level to less than 6% recently but has gained back some weight and reportedly his last hemoglobin A1c was again higher. He has a weakness for starchy foods such as potatoes and bread. He has successfully cut out sugary drinks. We discussed healthy dietary changes. He did not have significant coronary artery disease by angiography in 2011.

## 2013-01-19 NOTE — Progress Notes (Signed)
Patient ID: Brian Gray, male   DOB: 05/28/1945, 67 y.o.   MRN: [id number]      Reason for office visit Hypertension, hyperlipidemia, aortic aneurysm  Brian Gray continue to feel great. He is physically very active and has no cardiovascular complaints. He struggles to keep his weight down. In fact he has gained a couple of pounds since last year. He had managed to bring his hemoglobin A1c down to less than 6%, but tells me that at his last evaluation the results were not as good. He has had hemoglobin A1c is high at 6.6% in the past, consistent with full-blown diabetes. He does not take any medicines for diabetes. He does take a statin for hyperlipidemia and antihypertensives. Coronary angiography in 2011 did not show any significant atherosclerotic disease. He does have an incidentally discovered ascending aortic aneurysm that has been stable in size since 2010 with a maximum diameter of about 4.4 cm.   No Known Allergies  Current Outpatient Prescriptions  Medication Sig Dispense Refill  . aspirin EC 81 MG tablet Take 81 mg by mouth daily.        Marland Kitchen CINNAMON PO Take 1,000 mg by mouth daily.       . hydrochlorothiazide (HYDRODIURIL) 12.5 MG tablet Take 12.5 mg by mouth daily.        Marland Kitchen lisinopril (PRINIVIL,ZESTRIL) 10 MG tablet Take 10 mg by mouth daily.      . metoprolol tartrate (LOPRESSOR) 25 MG tablet Take 12.5 mg by mouth daily.        . naproxen sodium (ANAPROX) 220 MG tablet Take 220 mg by mouth every 8 (eight) hours as needed. For pain      . potassium chloride (MICRO-K) 10 MEQ CR capsule Take 10 mEq by mouth daily as needed.      . rosuvastatin (CRESTOR) 20 MG tablet Take 20 mg by mouth daily.        . vitamin B-12 (CYANOCOBALAMIN) 1000 MCG tablet Take 1,000 mcg by mouth daily.         No current facility-administered medications for this visit.    Past Medical History  Diagnosis Date  . Hypertension   . Hyperlipidemia     Past Surgical History  Procedure Laterality Date  . Cardiac  catheterization    . Colonoscopy  05/15/2011    Procedure: COLONOSCOPY;  Surgeon: Malissa Hippo, MD;  Location: [facility];  Service: Endoscopy;  Laterality: N/A;  930    Family History  Problem Relation Age of Onset  . Colon cancer Neg Hx     History   Social History  . Marital Status: Married    Spouse Name: N/A    Number of Children: N/A  . Years of Education: N/A   Occupational History  . Not on file.   Social History Main Topics  . Smoking status: Never Smoker   . Smokeless tobacco: Not on file  . Alcohol Use: No  . Drug Use: No  . Sexual Activity:    Other Topics Concern  . Not on file   Social History Narrative  . No narrative on file    Review of systems: The patient specifically denies any chest pain at rest or with exertion, dyspnea at rest or with exertion, orthopnea, paroxysmal nocturnal dyspnea, syncope, palpitations, focal neurological deficits, intermittent claudication, lower extremity edema, unexplained weight gain, cough, hemoptysis or wheezing.  The patient also denies abdominal pain, nausea, vomiting, dysphagia, diarrhea, constipation, polyuria, polydipsia, dysuria, hematuria, frequency, urgency, abnormal  bleeding or bruising, fever, chills, unexpected weight changes, mood swings, change in skin or hair texture, change in voice quality, auditory or visual problems, allergic reactions or rashes, new musculoskeletal complaints other than usual "aches and pains".   PHYSICAL EXAM BP 126/78  Pulse 73  Resp 16  Ht 6' (1.829 m)  Wt 201 lb 8 oz (91.4 kg)  BMI 27.32 kg/m2  General: Alert, oriented x3, no distress Head: no evidence of trauma, PERRL, EOMI, no exophtalmos or lid lag, no myxedema, no xanthelasma; normal ears, nose and oropharynx Neck: normal jugular venous pulsations and no hepatojugular reflux; brisk carotid pulses without delay and no carotid bruits Chest: clear to auscultation, no signs of consolidation by percussion or palpation,  normal fremitus, symmetrical and full respiratory excursions Cardiovascular: normal position and quality of the apical impulse, regular rhythm, normal first and second heart sounds, no murmurs, rubs or gallops Abdomen: no tenderness or distention, no masses by palpation, no abnormal pulsatility or arterial bruits, normal bowel sounds, no hepatosplenomegaly Extremities: no clubbing, cyanosis or edema; 2+ radial, ulnar and brachial pulses bilaterally; 2+ right femoral, posterior tibial and dorsalis pedis pulses; 2+ left femoral, posterior tibial and dorsalis pedis pulses; no subclavian or femoral bruits Neurological: grossly nonfocal   EKG: Normal sinus rhythm, right bundle branch block (old)  Lipid Panel August 2014 Addendum: Total cholesterol 127, HDL 33, triglycerides 127, LDL 69, glucose 138, hemoglobin A1c 7.3%, creatinine 1.0 10/18/2012  BMET    Component Value Date/Time   CREATININE 1.11 03/12/2010 1335   GFRNONAA >60 03/12/2010 1335   GFRAA  Value: >60        The eGFR has been calculated using the MDRD equation. This calculation has not been validated in all clinical situations. eGFR's persistently <60 mL/min signify possible Chronic Kidney Disease. 03/12/2010 1335     ASSESSMENT AND PLAN Ascending aorta dilatation His ascending aortic aneurysm measures only 44 mm in diameter and is completely unchanged when compared with 2010. Yearly radiological evaluation it appears to be excessive. Will reduce the frequency to every other year. He needs to call us if he develops lingering chest discomfort.  Hypertension Well treated. Beta blockers are a preferred ingredients in his antihypertensive cocktail.  Hyperlipidemia On statin therapy. We'll get results from Dr. Michelle Nasuti office. He also has diabetes mellitus that comes and goes depending on his diet and weight. He had reduced his A1c level to less than 6% recently but has gained back some weight and reportedly his last hemoglobin A1c was  again higher. He has a weakness for starchy foods such as potatoes and bread. He has successfully cut out sugary drinks. We discussed healthy dietary changes. He did not have significant coronary artery disease by angiography in 2011.  Addendum: Total cholesterol 127, HDL 33, triglycerides 127, LDL 69, glucose 138, hemoglobin A1c 7.3%, creatinine 1.0 10/18/2012 Orders Placed This Encounter  Procedures  . EKG 12-Lead   Meds ordered this encounter  Medications  . potassium chloride (MICRO-K) 10 MEQ CR capsule    Sig: Take 10 mEq by mouth daily as needed.    Junious Silk, MD, [facility] [phone] office [phone] pager

## 2013-01-19 NOTE — Assessment & Plan Note (Signed)
His ascending aortic aneurysm measures only 44 mm in diameter and is completely unchanged when compared with 2010. Yearly radiological evaluation it appears to be excessive. Will reduce the frequency to every other year. He needs to call us if he develops lingering chest discomfort.

## 2013-01-25 ENCOUNTER — Encounter: Payer: Self-pay | Admitting: Cardiovascular Disease

## 2013-02-10 ENCOUNTER — Other Ambulatory Visit: Payer: Self-pay | Admitting: Cardiovascular Disease

## 2013-02-11 NOTE — Telephone Encounter (Signed)
Rx was sent to pharmacy electronically. 

## 2014-02-15 ENCOUNTER — Encounter (HOSPITAL_COMMUNITY): Payer: Self-pay | Admitting: Cardiovascular Disease

## 2014-03-14 ENCOUNTER — Other Ambulatory Visit: Payer: Self-pay | Admitting: *Deleted

## 2014-03-14 DIAGNOSIS — I712 Thoracic aortic aneurysm, without rupture, unspecified: Secondary | ICD-10-CM

## 2014-04-06 ENCOUNTER — Telehealth: Payer: Self-pay | Admitting: Cardiovascular Disease

## 2014-04-06 NOTE — Telephone Encounter (Signed)
Brian Gray is calling because he received a letter stating that he has an appointment with Dr. [name] , but wants to know should he keep that when Dr.Croitoru is Cardiologist or should he just cancel the appointment .Please call   Thanks

## 2014-04-06 NOTE — Telephone Encounter (Signed)
Returned call to patient no answer.LMTC. 

## 2014-04-07 NOTE — Telephone Encounter (Signed)
Pt. Called back no answer not able to leave message

## 2014-04-10 ENCOUNTER — Telehealth: Payer: Self-pay | Admitting: Cardiovascular Disease

## 2014-04-10 NOTE — Telephone Encounter (Signed)
Returned call to patient. Informed him that Dr. Loletha Grayer is his cardiologist and Dr. [name] follows his aortic aneurysm so he would need to see each specialist for their respective modalities. He voiced understanding. OV made with Dr. Loletha Grayer for 05/15/14

## 2014-04-10 NOTE — Telephone Encounter (Signed)
Pt called in concerned about if he needed to keep his appt  With Dr. [name] since Dr. Loletha Grayer is now his cardiologist . Please f/u  Thanks

## 2014-04-24 ENCOUNTER — Telehealth: Payer: Self-pay | Admitting: Cardiovascular Disease

## 2014-04-24 LAB — CREATININE, SERUM: Creat: 1.02 mg/dL (ref 0.50–1.35)

## 2014-04-24 LAB — BUN: BUN: 13 mg/dL (ref 6–23)

## 2014-04-27 ENCOUNTER — Ambulatory Visit (INDEPENDENT_AMBULATORY_CARE_PROVIDER_SITE_OTHER): Payer: Medicare Other | Admitting: Cardiothoracic Surgery

## 2014-04-27 ENCOUNTER — Ambulatory Visit
Admission: RE | Admit: 2014-04-27 | Discharge: 2014-04-27 | Disposition: A | Discharge status: Home / Self Care | Payer: Medicare Other | Source: Ambulatory Visit | Attending: Cardiothoracic Surgery | Admitting: Cardiothoracic Surgery

## 2014-04-27 ENCOUNTER — Encounter: Payer: Self-pay | Admitting: Cardiothoracic Surgery

## 2014-04-27 DIAGNOSIS — I712 Thoracic aortic aneurysm, without rupture, unspecified: Secondary | ICD-10-CM

## 2014-04-27 DIAGNOSIS — I77810 Thoracic aortic ectasia: Secondary | ICD-10-CM

## 2014-04-27 MED ORDER — IOHEXOL 350 MG/ML SOLN
75.0000 mL | Freq: Once | INTRAVENOUS | Status: AC | PRN
  Administered 2014-04-27: 75 mL via INTRAVENOUS

## 2014-04-27 NOTE — Progress Notes (Signed)
[facility] Record #[id number] Date of Birth: 30-Sep-1945  Referring: [name], MD Primary Care: Robert Bellow, MD  Chief Complaint:    Chief Complaint  Patient presents with  . Follow-up    2 year f/u with CTA Chest  . Thoracic Aortic Aneurysm    History of Present Illness:    Patient returns today with a recent CT scan of the chest. I originally saw him in February 2011 for a dilated acending aorta at that time there was approximately 4.4 cm in size and had been known about for 4-5 years. He has had  a cardiac catheterization 02/21/11 that showed no significant stenosis. This was as a result of a faults positive stress test.  The patient denies significant shortness of breath dyspnea on exertion or chest discomfort. He has no family history of abnormal aorta aortic aneurysms or sudden death from unknown cause.  Current Activity/ Functional Status:  Patient is independent with mobility/ambulation, transfers, ADL's, IADL's.  Zubrod Score: At the time of surgery this patient's most appropriate activity status/level should be described as: [x]  Normal activity, no symptoms []  Symptoms, fully ambulatory []  Symptoms, in bed less than or equal to 50% of the time []  Symptoms, in bed greater than 50% of the time but less than 100% []  Bedridden []  Moribund   Past Medical History  Diagnosis Date  . Hypertension   . Hyperlipidemia     Past Surgical History  Procedure Laterality Date  . Cardiac catheterization    . Colonoscopy  05/15/2011    Procedure: COLONOSCOPY;  Surgeon: [name], MD;  [facility];  Service: Endoscopy;  Laterality: N/A;  930  . Left heart catheterization with coronary angiogram N/A 02/21/2011    Procedure: LEFT HEART CATHETERIZATION WITH CORONARY ANGIOGRAM;  Surgeon: [name], MD;  Location: [facility];  Service: Cardiovascular;  Laterality: N/A;    Family History  Problem  Relation Age of Onset  . Colon cancer Neg Hx   father died ruptured stomach at age 9 ulcer , three brothers have has heart surgery no history dissection  History   Social History  . Marital Status: Married    Spouse Name: N/A  . Number of Children: N/A  . Years of Education: N/A   Occupational History  . Not on file.   Social History Main Topics  . Smoking status: Never Smoker   . Smokeless tobacco: Not on file  . Alcohol Use: No  . Drug Use: No  . Sexual Activity: Not on file   Other Topics Concern  . Not on file   Social History Narrative    History  Smoking status  . Never Smoker   Smokeless tobacco  . Not on file    History  Alcohol Use No     No Known Allergies  Current Outpatient Prescriptions  Medication Sig Dispense Refill  . aspirin EC 81 MG tablet Take 81 mg by mouth daily.      Marland Kitchen CINNAMON PO Take 1,000 mg by mouth daily.     . hydrochlorothiazide (HYDRODIURIL) 12.5 MG tablet Take 12.5 mg by mouth daily.      Marland Kitchen lisinopril (PRINIVIL,ZESTRIL) 10 MG tablet Take 10 mg by mouth daily.    . metFORMIN (GLUCOPHAGE) 500 MG tablet Take by mouth daily with breakfast.    . metoprolol tartrate (LOPRESSOR) 25  MG tablet Take 12.5 mg by mouth daily.      . naproxen sodium (ANAPROX) 220 MG tablet Take 220 mg by mouth every 8 (eight) hours as needed. For pain    . potassium chloride (MICRO-K) 10 MEQ CR capsule Take 10 mEq by mouth daily as needed.    . vitamin B-12 (CYANOCOBALAMIN) 1000 MCG tablet Take 1,000 mcg by mouth daily.       No current facility-administered medications for this visit.       Review of Systems:     Cardiac Review of Systems: Y or N  Chest Pain [   n ]  Resting SOB [  n ] Exertional SOB  [ n]  Orthopnea [  ]   Pedal Edema [  n ]    Palpitations [ n ] Syncope  [ n ]   Presyncope [ n  ]  General Review of Systems: [Y] = yes [  ]=no Constitional: recent weight change [  ]; anorexia [  ]; fatigue [ n ]; nausea [  ]; night sweats [  ]; fever  [  ]; or chills [  ];                                                                                                                                          Dental: poor dentition[n  ]; Last Dentist visit: twice a year  Eye : blurred vision [  ]; diplopia [   ]; vision changes [  ];  Amaurosis fugax[  ]; Resp: cough [  ];  wheezing[  ];  hemoptysis[  ]; shortness of breath[  ]; paroxysmal nocturnal dyspnea[  ]; dyspnea on exertion[  ]; or orthopnea[  ];  GI:  gallstones[  ], vomiting[  ];  dysphagia[  ]; melena[  ];  hematochezia [  ]; heartburn[  ];   Hx of  Colonoscopy[ y ]; GU: kidney stones [  ]; hematuria[  ];   dysuria [  ];  nocturia[  ];  history of     obstruction [  ]; urinary frequency [  ]             Skin: rash, swelling[  ];, hair loss[  ];  peripheral edema[  ];  or itching[  ]; Musculosketetal: myalgias[  ];  joint swelling[  ];  joint erythema[  ];  joint pain[  ];  back pain[  ];  Heme/Lymph: bruising[  ];  bleeding[  ];  anemia[  ];  Neuro: TIA[  ];  headaches[  ];  stroke[  ];  vertigo[  ];  seizures[  ];   paresthesias[  ];  difficulty walking[  ];  Psych:depression[  ]; anxiety[  ];  Endocrine: diabetes[  ];  thyroid dysfunction[  ];  Immunizations: Flu Blue.Reese  ]; Pneumococcal[n  ];  Other:  Physical Exam: BP 131/76  mmHg  Pulse 87  Resp 16  Ht 6' (1.829 m)  Wt 185 lb (83.915 kg)  BMI 25.08 kg/m2  SpO2 97%  General appearance: alert, cooperative, appears stated age and no distress Neurologic: intact Heart: regular rate and rhythm, S1, S2 normal, no murmur, click, rub or gallop and normal apical impulse Lungs: clear to auscultation bilaterally Abdomen: soft, non-tender; bowel sounds normal; no masses,  no organomegaly Extremities: extremities normal, atraumatic, no cyanosis or edema and Homans sign is negative, no sign of DVT Full dp and pt pulses, no carotid bruits  Diagnostic Studies & Laboratory data:     Recent Radiology Findings:  Ct Angio Chest Aorta W/cm  &/or Wo/cm  04/27/2014   CLINICAL DATA:  Known thoracic aortic aneurysm, followup examination, left-sided chest discomfort  EXAM: CT ANGIOGRAPHY CHEST WITH CONTRAST  TECHNIQUE: Multidetector CT imaging of the chest was performed using the standard protocol during bolus administration of intravenous contrast. Multiplanar CT image reconstructions and MIPs were obtained to evaluate the vascular anatomy.  CONTRAST:  47mL OMNIPAQUE IOHEXOL 350 MG/ML SOLN  COMPARISON:  03/12/2010  FINDINGS: The lungs are well aerated bilaterally. No focal infiltrate or sizable effusion is seen. No parenchymal nodules are noted.  The thoracic inlet is within normal limits. The thoracic aorta and its branches opacified well. There is again prominence of the ascending aorta measuring 4.3 x 4.0 cm in greatest dimension. Given some variation in the imaging technique this is stable in appearance from the prior exam. No changes to suggest dissection are seen. The descending thoracic aorta tapers in a normal fashion.  No hilar or mediastinal adenopathy is noted. The visualized portion of the pulmonary artery shows no gross abnormality. Coronary calcifications are again identified and stable. The upper abdomen shows no acute abnormality. A left renal cyst is noted. No acute bony abnormality is seen.  Review of the MIP images confirms the above findings.  IMPRESSION: Stable appearing ascending aortic aneurysm measuring 4.3 cm. Recommend annual imaging followup by CTA or MRA. This recommendation follows 2010 ACCF/AHA/AATS/ACR/ASA/SCA/SCAI/SIR/STS/SVM Guidelines for the Diagnosis and Management of Patients with Thoracic Aortic Disease. Circulation. 2010; 121: X324-M010  No other focal abnormality is seen.   Electronically Signed   By: Inez Catalina M.D.   On: 04/27/2014 11:28    I have independently reviewed the above radiology studies  and reviewed the findings with the patient.   RADIOLOGY REPORT*  Clinical Data: Evaluate ascending aortic  aneurysm  CT ANGIOGRAPHY CHEST  Technique: Multidetector CT imaging of the chest using the  standard protocol during bolus administration of intravenous  contrast. Multiplanar reconstructed images including MIPs were  obtained and reviewed to evaluate the vascular anatomy.  Contrast: 155mL OMNIPAQUE IOHEXOL 350 MG/ML SOLN  Comparison: Chest CT - 03/12/2010; 03/24/2008  Vascular Findings:  There is grossly unchanged fusiform ectasia of the ascending  thoracic aorta with measurements as follows. The aorta tapers to a  normal caliber at the level of the aortic arch. 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 Normal caliber of the  descending thoracic aorta. No evidence of thoracic aortic  dissection. No periaortic stranding. Minimal calcifications within  the aortic valve annulus.  Sinotubular junction: 36 mm as measured in greatest oblique coronal  dimension, unchanged.  Proximal ascending aorta: 42 x 44 mm as measured in greatest  oblique axial dimension at the level of the main pulmonary artery.  The ascending thoracic aorta measures approximately 44 mm in  greatest oblique coronal dimension. These  measurements are grossly  unchanged since the 03/2008 examination  Aortic arch aorta: 26 mm as measured in greatest oblique sagittal  dimension.  Proximal descending thoracic aorta: 32 x 30 mm as measured in  greatest oblique axial dimension at the level of the main pulmonary  artery, unchanged.  Distal descending thoracic aorta: 26 x 25 mm as measured in  greatest oblique axial dimension at the level of the diaphragmatic  hiatus, unchanged.  Borderline cardiomegaly. Minimal coronary artery calcifications.  No pericardial effusion. Although this examination was not  tailored for evaluation of the pulmonary arteries, there are no  discrete filling defects within the central pulmonary arterial tree  to suggest pulmonary embolism. Normal caliber of the main  pulmonary artery.   ----------------------------------------------  Nonvascular findings:  Minimal dependent subpleural ground-glass atelectasis. No focal  airspace opacities. No pleural effusion or pneumothorax. The  central pulmonary airways are patent. The punctate granuloma in  the right middle lobe (image 43, series six). No mediastinal,  hilar or axillary lymphadenopathy.  Early arterial phase evaluation of the upper abdomen is  unremarkable.  No acute or aggressive osseous abnormalities.  IMPRESSION:  Unchanged fusiform ectasia of the ascending thoracic aorta  measuring approximately 44 mm in greatest oblique axial dimension,  grossly unchanged since the 03/2008 examination.  Original Report Authenticated By: Jake Seats, MD   Recent Lab Findings: Lab Results  Component Value Date   CREATININE 1.02 04/24/2014   BUN 13 04/24/2014   Aortic Size Index=       4.4  /Body surface area is 2.06 meters squared. =2.13  < 2.75 cm/m2      4% risk per year 2.75 to 4.25          8% risk per year > 4.25 cm/m2    20% risk per year     Assessment / Plan:    Patient is asymptomatic with stable appearing dilatation of the ascending aorta less than 4.5 cm in size, on exam he has no concomitant aortic valve disease. I recommended that we continue with good blood pressure control including beta blocker. I'll plan to see him back again in one years with a followup CT scan of the chest.  According to the 2010 ACC/AHA guidelines, we recommend patients with thoracic aortic disease to maintain a LDL of less than 70 and a HDL of greater than 50. We recommend their blood pressure to remain less than 135/85.    Grace Isaac MD  Beeper [phone] Office [phone] 04/27/2014 7:38 PM

## 2014-05-01 NOTE — Telephone Encounter (Signed)
Closed encounter °

## 2014-05-15 ENCOUNTER — Ambulatory Visit: Payer: Self-pay | Admitting: Cardiovascular Disease

## 2014-05-26 ENCOUNTER — Ambulatory Visit (INDEPENDENT_AMBULATORY_CARE_PROVIDER_SITE_OTHER): Payer: Medicare Other | Admitting: Cardiovascular Disease

## 2014-05-26 ENCOUNTER — Encounter: Payer: Self-pay | Admitting: Cardiovascular Disease

## 2014-05-26 VITALS — BP 102/66 | HR 88 | Ht 72.0 in | Wt 195.1 lb

## 2014-05-26 DIAGNOSIS — I712 Thoracic aortic aneurysm, without rupture: Secondary | ICD-10-CM

## 2014-05-26 DIAGNOSIS — I10 Essential (primary) hypertension: Secondary | ICD-10-CM

## 2014-05-26 DIAGNOSIS — Z79899 Other long term (current) drug therapy: Secondary | ICD-10-CM

## 2014-05-26 DIAGNOSIS — E785 Hyperlipidemia, unspecified: Secondary | ICD-10-CM

## 2014-05-26 DIAGNOSIS — I77810 Thoracic aortic ectasia: Secondary | ICD-10-CM

## 2014-05-26 NOTE — Patient Instructions (Signed)
Your physician recommends that you return for lab work in: FASTING at [facility].  Dr. Sallyanne Kuster recommends that you schedule a follow-up appointment in: One year.

## 2014-05-28 NOTE — Progress Notes (Signed)
Patient ID: Brian Gray, male   DOB: 1945/08/09, 69 y.o.   MRN: [id number]     Cardiology Office Note   Date:  05/28/2014   ID:  [name], [name] 22-Feb-1946, MRN [id number]  PCP:  Robert Bellow, MD  Cardiologist:   [name], MD   Chief Complaint  Patient presents with  . Follow-up    Yearly:  No chest pain but an uncomfortable feeling left side that comes and goes pretty quick.  No symptoms with activities.  No SOB, edema or dizziness.      History of Present Illness: Brian Gray is a 69 y.o. male who presents for follow up of HTN , hyperlipidemia and a small aneurysm of the ascending aorta, 44 mm diameter (stable size since 2010, followed by Dr. [name], last CT and office visit in February 2016). He is borderline hyperglycemic, but does not take any medicines for diabetes. He does take a statin for hyperlipidemia and antihypertensives. Coronary angiography in 2011 did not show any significant atherosclerotic disease (false positive stress test).  He has no complaints with exertion. Has an occasional unpleasant brief sensation that he cannot describe better. Occurs at rest and is very brief.    Past Medical History  Diagnosis Date  . Hypertension   . Hyperlipidemia     Past Surgical History  Procedure Laterality Date  . Cardiac catheterization    . Colonoscopy  05/15/2011    Procedure: COLONOSCOPY;  Surgeon: [name], MD;  [facility]: [facility];  Service: Endoscopy;  Laterality: N/A;  930  . Left heart catheterization with coronary angiogram N/A 02/21/2011    Procedure: LEFT HEART CATHETERIZATION WITH CORONARY ANGIOGRAM;  Surgeon: [name], MD;  Location: [facility];  Service: Cardiovascular;  Laterality: N/A;  . Doppler echocardiography  03/20/2008    EF>55%  . Nm myoview ltd  02/04/2011    EF 58%  . Cardiac catheterization  02/21/2011    Normal Coronary Arteries     Current Outpatient Prescriptions  Medication Sig Dispense Refill  . aspirin EC  81 MG tablet Take 81 mg by mouth daily.      Marland Kitchen atorvastatin (LIPITOR) 40 MG tablet Take 1 tablet by mouth daily.  3  . CINNAMON PO Take 1,000 mg by mouth daily.     . hydrochlorothiazide (HYDRODIURIL) 12.5 MG tablet Take 12.5 mg by mouth daily.      Marland Kitchen lisinopril (PRINIVIL,ZESTRIL) 10 MG tablet Take 10 mg by mouth daily.    . metFORMIN (GLUCOPHAGE) 500 MG tablet Take by mouth daily with breakfast.    . metoprolol tartrate (LOPRESSOR) 25 MG tablet Take 12.5 mg by mouth daily.      . naproxen sodium (ANAPROX) 220 MG tablet Take 220 mg by mouth every 8 (eight) hours as needed. For pain    . potassium chloride (MICRO-K) 10 MEQ CR capsule Take 10 mEq by mouth daily as needed.    . vitamin B-12 (CYANOCOBALAMIN) 1000 MCG tablet Take 1,000 mcg by mouth daily.       No current facility-administered medications for this visit.    Allergies:   Review of patient's allergies indicates no known allergies.    Social History:  The patient  reports that he has never smoked. He does not have any smokeless tobacco history on file. He reports that he does not drink alcohol or use illicit drugs.   Family History:  The patient's family history is negative for Colon cancer.    ROS:  Please see the history of present illness.    The patient specifically denies any chest pain at rest or with exertion, dyspnea at rest or with exertion, orthopnea, paroxysmal nocturnal dyspnea, syncope, palpitations, focal neurological deficits, intermittent claudication, lower extremity edema, unexplained weight gain, cough, hemoptysis or wheezing.  The patient also denies abdominal pain, nausea, vomiting, dysphagia, diarrhea, constipation, polyuria, polydipsia, dysuria, hematuria, frequency, urgency, abnormal bleeding or bruising, fever, chills, unexpected weight changes, mood swings, change in skin or hair texture, change in voice quality, auditory or visual problems, allergic reactions or rashes, new musculoskeletal complaints other  than usual "aches and pains".  Otherwise, review of systems positive for none.   All other systems are reviewed and negative.    PHYSICAL EXAM: VS:  BP 102/66 mmHg  Pulse 88  Ht 6' (1.829 m)  Wt 195 lb 1.6 oz (88.497 kg)  BMI 26.45 kg/m2 , BMI Body mass index is 26.45 kg/(m^2).  General: Alert, oriented x3, no distress Head: no evidence of trauma, PERRL, EOMI, no exophtalmos or lid lag, no myxedema, no xanthelasma; normal ears, nose and oropharynx Neck: normal jugular venous pulsations and no hepatojugular reflux; brisk carotid pulses without delay and no carotid bruits Chest: clear to auscultation, no signs of consolidation by percussion or palpation, normal fremitus, symmetrical and full respiratory excursions Cardiovascular: normal position and quality of the apical impulse, regular rhythm, normal first and second heart sounds, no murmurs, rubs or gallops Abdomen: no tenderness or distention, no masses by palpation, no abnormal pulsatility or arterial bruits, normal bowel sounds, no hepatosplenomegaly Extremities: no clubbing, cyanosis or edema; 2+ radial, ulnar and brachial pulses bilaterally; 2+ right femoral, posterior tibial and dorsalis pedis pulses; 2+ left femoral, posterior tibial and dorsalis pedis pulses; no subclavian or femoral bruits Neurological: grossly nonfocal Psych: euthymic mood, full affect   EKG:  EKG is ordered today. The ekg ordered today demonstrates NSR, RBBB (old)   Recent Labs: 04/24/2014: BUN 13; Creatinine 1.02    Lipid Panel One year ago:Total cholesterol 127, HDL 33, triglycerides 127, LDL 69, glucose 138, hemoglobin A1c 7.3%, creatinine 1.0    Wt Readings from Last 3 Encounters:  05/26/14 195 lb 1.6 oz (88.497 kg)  04/27/14 185 lb (83.915 kg)  01/19/13 201 lb 8 oz (91.4 kg)      Other studies Reviewed: Additional studies/ records that were reviewed today include: CT chest and Dr. Everrett Coombe note 04/2014. Review of the above records  demonstrates: stable aneurysm size and no change in f/u plans   ASSESSMENT AND PLAN:  Ascending aorta dilatation His ascending aortic aneurysm measures only 44 mm in diameter and is completely unchanged when compared with 2010. He needs to call us if he develops lingering chest discomfort.  Hypertension Well treated. Beta blockers are a preferred ingredient in his antihypertensive cocktail.  Hyperlipidemia On statin therapy. We'll get results from Dr. Vickey Sages office. He also has diabetes mellitus that comes and goes depending on his diet and weight. He had reduced his A1c level to less than 6% recently but has gained back some weight and reportedly his last hemoglobin A1c was again higher. He has a weakness for starchy foods such as potatoes and bread. He has successfully cut out sugary drinks. We discussed healthy dietary changes. He did not have significant coronary artery disease by angiography in 2011.   Current medicines are reviewed at length with the patient today.  The patient does not have concerns regarding medicines.  The following changes have been made:  no change  Labs/ tests ordered today include:  Orders Placed This Encounter  Procedures  . Lipid panel  . Comprehensive metabolic panel  . EKG Shary Key, MD  05/28/2014 7:25 PM    [name], MD, [facility] [phone] office [phone] pager

## 2015-03-27 ENCOUNTER — Other Ambulatory Visit: Payer: Self-pay | Admitting: *Deleted

## 2015-03-27 DIAGNOSIS — I77810 Thoracic aortic ectasia: Secondary | ICD-10-CM

## 2015-04-23 LAB — CREATININE, SERUM: Creat: 1.04 mg/dL (ref 0.70–1.25)

## 2015-04-26 ENCOUNTER — Ambulatory Visit
Admission: RE | Admit: 2015-04-26 | Discharge: 2015-04-26 | Disposition: A | Discharge status: Home / Self Care | Payer: Medicare Other | Source: Ambulatory Visit | Attending: Cardiothoracic Surgery | Admitting: Cardiothoracic Surgery

## 2015-04-26 ENCOUNTER — Ambulatory Visit (INDEPENDENT_AMBULATORY_CARE_PROVIDER_SITE_OTHER): Payer: Medicare Other | Admitting: Cardiothoracic Surgery

## 2015-04-26 ENCOUNTER — Encounter: Payer: Self-pay | Admitting: Cardiothoracic Surgery

## 2015-04-26 VITALS — BP 133/80 | HR 96 | Resp 20 | Ht 72.0 in | Wt 195.0 lb

## 2015-04-26 DIAGNOSIS — I77810 Thoracic aortic ectasia: Secondary | ICD-10-CM

## 2015-04-26 MED ORDER — IOPAMIDOL (ISOVUE-370) INJECTION 76%
75.0000 mL | Freq: Once | INTRAVENOUS | Status: AC | PRN
  Administered 2015-04-26: 75 mL via INTRAVENOUS

## 2015-04-26 NOTE — Progress Notes (Signed)
[facility] Record [id number] Date of Birth: October 23, 1945  Referring: [name], MD Primary Care: Robert Bellow, MD  Chief Complaint:    Chief Complaint  Patient presents with  . Thoracic Aortic Aneurysm    1 year f/u wtih CTA Chest    History of Present Illness:    Patient returns today with a recent CT scan of the chest. I originally saw him in February 2011 for a dilated acending aorta at that time there was approximately 4.4 cm in size and had been known about for 4-5 years. He has had  a cardiac catheterization 02/21/11 that showed no significant stenosis. This was as a result of a faults positive stress test.  The patient denies significant shortness of breath dyspnea on exertion or chest discomfort. For the past year is been helping his son build a new garage, he remains active without anginal chest pain  He has no family history of abnormal aorta aortic aneurysms or sudden death from unknown cause.  Current Activity/ Functional Status:  Patient is independent with mobility/ambulation, transfers, ADL's, IADL's.  Zubrod Score: At the time of surgery this patient's most appropriate activity status/level should be described as: [x]  Normal activity, no symptoms []  Symptoms, fully ambulatory []  Symptoms, in bed less than or equal to 50% of the time []  Symptoms, in bed greater than 50% of the time but less than 100% []  Bedridden []  Moribund   Past Medical History  Diagnosis Date  . Hypertension   . Hyperlipidemia     Past Surgical History  Procedure Laterality Date  . Cardiac catheterization    . Colonoscopy  05/15/2011    Procedure: COLONOSCOPY;  Surgeon: [name], MD;  Location: [facility];  Service: Endoscopy;  Laterality: N/A;  930  . Left heart catheterization with coronary angiogram N/A 02/21/2011    Procedure: LEFT HEART CATHETERIZATION WITH CORONARY ANGIOGRAM;  Surgeon: [name], MD;  Location:  [facility];  Service: Cardiovascular;  Laterality: N/A;  . Doppler echocardiography  03/20/2008    EF>55%  . Nm myoview ltd  02/04/2011    EF 58%  . Cardiac catheterization  02/21/2011    Normal Coronary Arteries    Family History  Problem Relation Age of Onset  . Colon cancer Neg Hx   father died ruptured stomach at age 90 ulcer , three brothers have has heart surgery no history dissection  Social History   Social History  . Marital Status: Married    Spouse Name: N/A  . Number of Children: N/A  . Years of Education: N/A   Occupational History  . Not on file.   Social History Main Topics  . Smoking status: Never Smoker   . Smokeless tobacco: Not on file  . Alcohol Use: No  . Drug Use: No  . Sexual Activity: Not on file   Other Topics Concern  . Not on file   Social History Narrative    History  Smoking status  . Never Smoker   Smokeless tobacco  . Not on file    History  Alcohol Use No     No Known Allergies  Current Outpatient Prescriptions  Medication Sig Dispense Refill  . aspirin EC 81 MG tablet Take 81 mg by mouth daily.      Marland Kitchen atorvastatin (LIPITOR) 40 MG tablet Take 1 tablet by mouth daily.  3  . CINNAMON PO Take 1,000 mg by mouth daily.     . hydrochlorothiazide (HYDRODIURIL) 12.5 MG tablet Take 12.5 mg by mouth daily.      Marland Kitchen lisinopril (PRINIVIL,ZESTRIL) 10 MG tablet Take 10 mg by mouth daily.    . metFORMIN (GLUCOPHAGE) 500 MG tablet Take by mouth daily with breakfast.    . metoprolol tartrate (LOPRESSOR) 25 MG tablet Take 12.5 mg by mouth daily.      . naproxen sodium (ANAPROX) 220 MG tablet Take 220 mg by mouth every 8 (eight) hours as needed. For pain    . potassium chloride (MICRO-K) 10 MEQ CR capsule Take 10 mEq by mouth daily as needed.    . vitamin B-12 (CYANOCOBALAMIN) 1000 MCG tablet Take 1,000 mcg by mouth daily.       No current facility-administered medications for this visit.       Review of Systems:     Cardiac Review of  Systems: Y or N  Chest Pain [   n ]  Resting SOB [  n ] Exertional SOB  [ n]  Orthopnea [  ]   Pedal Edema [  n ]    Palpitations [ n ] Syncope  [ n ]   Presyncope [ n  ]  General Review of Systems: [Y] = yes [  ]=no Constitional: recent weight change [  ]; anorexia [  ]; fatigue [ n ]; nausea [  ]; night sweats [  ]; fever [  ]; or chills [  ];                                                                                                                                          Dental: poor dentition[n  ]; Last Dentist visit: twice a year  Eye : blurred vision [  ]; diplopia [   ]; vision changes [  ];  Amaurosis fugax[  ]; Resp: cough [  ];  wheezing[  ];  hemoptysis[  ]; shortness of breath[  ]; paroxysmal nocturnal dyspnea[  ]; dyspnea on exertion[  ]; or orthopnea[  ];  GI:  gallstones[  ], vomiting[  ];  dysphagia[  ]; melena[  ];  hematochezia [  ]; heartburn[  ];   Hx of  Colonoscopy[ y ]; GU: kidney stones [  ]; hematuria[  ];   dysuria [  ];  nocturia[  ];  history of     obstruction [  ]; urinary frequency [  ]             Skin: rash, swelling[  ];, hair loss[  ];  peripheral edema[  ];  or itching[  ]; Musculosketetal: myalgias[  ];  joint swelling[  ];  joint erythema[  ];  joint pain[  ];  back pain[  ];  Heme/Lymph: bruising[  ];  bleeding[  ];  anemia[  ];  Neuro: TIA[  ];  headaches[  ];  stroke[  ];  vertigo[  ];  seizures[  ];   paresthesias[  ];  difficulty walking[  ];  Psych:depression[  ]; anxiety[  ];  Endocrine: diabetes[  ];  thyroid dysfunction[  ];  Immunizations: Flu Blue.Reese  ]; Pneumococcal[n  ];  Other:  Physical Exam: BP 133/80 mmHg  Pulse 96  Resp 20  Ht 6' (1.829 m)  Wt 195 lb (88.451 kg)  BMI 26.44 kg/m2  SpO2 96%  General appearance: alert, cooperative, appears stated age and no distress Neurologic: intact Heart: regular rate and rhythm, S1, S2 normal, no murmur, click, rub or gallop and normal apical impulse Lungs: clear to auscultation  bilaterally Abdomen: soft, non-tender; bowel sounds normal; no masses,  no organomegaly Extremities: extremities normal, atraumatic, no cyanosis or edema and Homans sign is negative, no sign of DVT Full dp and pt pulses, no carotid bruits  Diagnostic Studies & Laboratory data:     Recent Radiology Findings:   Ct Angio Chest Aorta W/cm &/or Wo/cm  04/26/2015  CLINICAL DATA:  Followup thoracic aortic aneurysm, occasional left-sided dull chest pain EXAM: CT ANGIOGRAPHY CHEST WITH CONTRAST TECHNIQUE: Multidetector CT imaging of the chest was performed using the standard protocol during bolus administration of intravenous contrast. Multiplanar CT image reconstructions and MIPs were obtained to evaluate the vascular anatomy. CONTRAST:  75 cc Isovue 370 COMPARISON:  CT angio chest of 04/27/2014 and CT chest of 03/12/2010 FINDINGS: On this enhanced study, the mid ascending thoracic aorta is measured at a similar level to previously, currently measuring 4.1 x 4.2 cm compared to 4.2 cm on the prior CT. The mid thoracic aortic arch measures 3.0 cm. The mid descending thoracic aorta measures 3.1 cm, and just above the diaphragmatic crus, the distal descending thoracic aorta measures 2.5 cm. The pulmonary arteries are well opacified and no acute abnormality is seen. No mediastinal or hilar adenopathy is seen. The thyroid gland is unremarkable. No pericardial effusion is seen. Diverticula are noted within the splenic flexure of colon. On lung window images, no lung parenchymal abnormality is seen. No pulmonary nodule is noted and there is no evidence of pleural effusion. The central airway is patent. The lumbar vertebrae are in normal alignment with normal intervertebral disc spaces. Review of the MIP images confirms the above findings. IMPRESSION: 1. Stable fusiform dilatation of the mid ascending aorta measuring 4.2 cm in maximum transverse diameter, unchanged compared to the prior CT measurements. 2. 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 Electronically Signed   By: Ivar Drape M.D.   On: 04/26/2015 14:17  I have independently reviewed the above radiology studies  and reviewed the findings with the patient.   RADIOLOGY REPORT*  Clinical Data: Evaluate ascending aortic aneurysm  CT ANGIOGRAPHY CHEST  Technique: Multidetector CT imaging of the chest using the  standard protocol during bolus administration of intravenous  contrast. Multiplanar reconstructed images including MIPs were  obtained and reviewed to evaluate the vascular anatomy.  Contrast: 182mL OMNIPAQUE IOHEXOL 350 MG/ML SOLN  Comparison: Chest CT - 03/12/2010; 03/24/2008  Vascular Findings:  There is grossly unchanged fusiform ectasia of the ascending  thoracic aorta with measurements as follows. The aorta tapers to a  normal caliber at the level of the aortic arch. 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 Normal caliber of the  descending thoracic aorta. No evidence of thoracic aortic  dissection. No periaortic stranding. Minimal  calcifications within  the aortic valve annulus.  Sinotubular junction: 36 mm as measured in greatest oblique coronal  dimension, unchanged.  Proximal ascending aorta: 42 x 44 mm as measured in greatest  oblique axial dimension at the level of the main pulmonary artery.  The ascending thoracic aorta measures approximately 44 mm in  greatest oblique coronal dimension. These measurements are grossly  unchanged since the 03/2008 examination  Aortic arch aorta: 26 mm as measured in greatest oblique sagittal  dimension.  Proximal descending thoracic aorta: 32 x 30 mm as measured in  greatest oblique axial dimension at the level of the main pulmonary  artery, unchanged.  Distal descending thoracic aorta: 26 x 25 mm as measured in  greatest oblique axial dimension at the level of the diaphragmatic  hiatus, unchanged.  Borderline cardiomegaly. Minimal coronary artery calcifications.   No pericardial effusion. Although this examination was not  tailored for evaluation of the pulmonary arteries, there are no  discrete filling defects within the central pulmonary arterial tree  to suggest pulmonary embolism. Normal caliber of the main  pulmonary artery.  ----------------------------------------------  Nonvascular findings:  Minimal dependent subpleural ground-glass atelectasis. No focal  airspace opacities. No pleural effusion or pneumothorax. The  central pulmonary airways are patent. The punctate granuloma in  the right middle lobe (image 43, series six). No mediastinal,  hilar or axillary lymphadenopathy.  Early arterial phase evaluation of the upper abdomen is  unremarkable.  No acute or aggressive osseous abnormalities.  IMPRESSION:  Unchanged fusiform ectasia of the ascending thoracic aorta  measuring approximately 44 mm in greatest oblique axial dimension,  grossly unchanged since the 03/2008 examination.  Original Report Authenticated By: Jake Seats, MD   Recent Lab Findings: Lab Results  Component Value Date   CREATININE 1.04 04/23/2015   BUN 13 04/24/2014   Aortic Size Index=       4.4  /Body surface area is 2.12 meters squared. =2.13  < 2.75 cm/m2      4% risk per year 2.75 to 4.25          8% risk per year > 4.25 cm/m2    20% risk per year     Assessment / Plan:    Patient is asymptomatic with stable appearing dilatation of the ascending aorta less than 4.5 cm in size, on exam he has no concomitant aortic valve disease. I recommended that we continue with good blood pressure control including beta blocker. I'll plan to see him back again in 18 months  with a followup CT scan of the chest. He was cautioned about doing excessive heavy lifting.  According to the 2010 ACC/AHA guidelines, we recommend patients with thoracic aortic disease to maintain a LDL of less than 70 and a HDL of greater than 50. We recommend their blood pressure to remain  less than 135/85.    Grace Isaac MD  Beeper [phone] Office [phone] 04/26/2015 3:11 PM

## 2016-09-23 ENCOUNTER — Other Ambulatory Visit: Payer: Self-pay | Admitting: Cardiothoracic Surgery

## 2016-09-23 DIAGNOSIS — I719 Aortic aneurysm of unspecified site, without rupture: Secondary | ICD-10-CM

## 2016-11-13 ENCOUNTER — Ambulatory Visit
Admission: RE | Admit: 2016-11-13 | Discharge: 2016-11-13 | Disposition: A | Discharge status: Home / Self Care | Payer: Medicare Other | Source: Ambulatory Visit | Attending: Cardiothoracic Surgery | Admitting: Cardiothoracic Surgery

## 2016-11-13 ENCOUNTER — Encounter: Payer: Self-pay | Admitting: Cardiothoracic Surgery

## 2016-11-13 ENCOUNTER — Ambulatory Visit (INDEPENDENT_AMBULATORY_CARE_PROVIDER_SITE_OTHER): Payer: Medicare Other | Admitting: Cardiothoracic Surgery

## 2016-11-13 VITALS — BP 147/92 | HR 62 | Resp 20 | Ht 72.0 in | Wt 193.0 lb

## 2016-11-13 DIAGNOSIS — I77810 Thoracic aortic ectasia: Secondary | ICD-10-CM | POA: Diagnosis not present

## 2016-11-13 DIAGNOSIS — I719 Aortic aneurysm of unspecified site, without rupture: Secondary | ICD-10-CM

## 2016-11-13 MED ORDER — IOPAMIDOL (ISOVUE-370) INJECTION 76%
75.0000 mL | Freq: Once | INTRAVENOUS | Status: AC | PRN
  Administered 2016-11-13: 75 mL via INTRAVENOUS

## 2016-11-13 NOTE — Progress Notes (Signed)
[facility] Record #[id number] Date of Birth: 1945-09-10  Referring: [name], MD Primary Care: [name], MD  Chief Complaint:    Chief Complaint  Patient presents with  . Thoracic Aortic Aneurysm    6 month f/u with CTA Chest    History of Present Illness:    Patient returns today with a recent CT scan of the chest. I originally saw him in February 2011 for a dilated acending aorta at that time there was approximately 4.4 cm in size and had been known about for 4-5 years. He has had  a cardiac catheterization 02/21/11 that showed no significant stenosis. This was as a result of a faults positive stress test.  The patient denies significant shortness of breath dyspnea on exertion or chest discomfort. For the past year is been helping his son build a new garage, he remains active without anginal chest pain  He has no family history of abnormal aorta aortic aneurysms or sudden death from unknown cause.  Current Activity/ Functional Status:  Patient is independent with mobility/ambulation, transfers, ADL's, IADL's.  Zubrod Score: At the time of surgery this patient's most appropriate activity status/level should be described as: [x]  Normal activity, no symptoms []  Symptoms, fully ambulatory []  Symptoms, in bed less than or equal to 50% of the time []  Symptoms, in bed greater than 50% of the time but less than 100% []  Bedridden []  Moribund   Past Medical History:  Diagnosis Date  . Hyperlipidemia   . Hypertension     Past Surgical History:  Procedure Laterality Date  . CARDIAC CATHETERIZATION    . CARDIAC CATHETERIZATION  02/21/2011   Normal Coronary Arteries  . COLONOSCOPY  05/15/2011   Procedure: COLONOSCOPY;  Surgeon: [name], MD;  Location: [facility];  Service: Endoscopy;  Laterality: N/A;  930  . DOPPLER ECHOCARDIOGRAPHY  03/20/2008   EF>55%  . LEFT HEART CATHETERIZATION WITH CORONARY ANGIOGRAM N/A  02/21/2011   Procedure: LEFT HEART CATHETERIZATION WITH CORONARY ANGIOGRAM;  Surgeon: [name], MD;  Location: [facility];  Service: Cardiovascular;  Laterality: N/A;  . NM MYOVIEW LTD  02/04/2011   EF 58%    Family History  Problem Relation Age of Onset  . Colon cancer Neg Hx   father died ruptured stomach at age 61 ulcer , three brothers have has heart surgery no history dissection  Social History   Social History  . Marital status: Married    Spouse name: N/A  . Number of children: N/A  . Years of education: N/A   Occupational History  . Not on file.   Social History Main Topics  . Smoking status: Never Smoker  . Smokeless tobacco: Never Used  . Alcohol use No  . Drug use: No  . Sexual activity: Yes    Partners: Female    Birth control/ protection: None   Other Topics Concern  . Not on file   Social History Narrative  . No narrative on file    History  Smoking Status  . Never Smoker  Smokeless Tobacco  . Never Used    History  Alcohol Use No     No Known Allergies  Current Outpatient Prescriptions  Medication Sig Dispense Refill  . aspirin EC 81 MG tablet Take 81 mg by mouth daily.      Marland Kitchen atorvastatin (LIPITOR) 40 MG  tablet Take 1 tablet by mouth daily.  3  . CINNAMON PO Take 1,000 mg by mouth daily.     . hydrochlorothiazide (HYDRODIURIL) 12.5 MG tablet Take 12.5 mg by mouth daily.      Marland Kitchen lisinopril (PRINIVIL,ZESTRIL) 10 MG tablet Take 10 mg by mouth daily.    . metFORMIN (GLUCOPHAGE) 500 MG tablet Take by mouth daily with breakfast.    . metoprolol tartrate (LOPRESSOR) 25 MG tablet Take 12.5 mg by mouth daily.      . naproxen sodium (ANAPROX) 220 MG tablet Take 220 mg by mouth every 8 (eight) hours as needed. For pain    . potassium chloride (MICRO-K) 10 MEQ CR capsule Take 10 mEq by mouth daily as needed.    . vitamin B-12 (CYANOCOBALAMIN) 1000 MCG tablet Take 1,000 mcg by mouth daily.       No current facility-administered medications for  this visit.        Review of Systems:     Cardiac Review of Systems: Y or N  Chest Pain [   n ]  Resting SOB [  n ] Exertional SOB  [ n]  Orthopnea [  ]   Pedal Edema [  n ]    Palpitations [ n ] Syncope  [ n ]   Presyncope [ n  ]  General Review of Systems: [Y] = yes [  ]=no Constitional: recent weight change [  ]; anorexia [  ]; fatigue [ n ]; nausea [  ]; night sweats [  ]; fever [  ]; or chills [  ];                                                                                                                                          Dental: poor dentition[n  ]; Last Dentist visit: twice a year  Eye : blurred vision [  ]; diplopia [   ]; vision changes [  ];  Amaurosis fugax[  ]; Resp: cough [  ];  wheezing[  ];  hemoptysis[  ]; shortness of breath[  ]; paroxysmal nocturnal dyspnea[  ]; dyspnea on exertion[  ]; or orthopnea[  ];  GI:  gallstones[  ], vomiting[  ];  dysphagia[  ]; melena[  ];  hematochezia [  ]; heartburn[  ];   Hx of  Colonoscopy[ y ]; GU: kidney stones [  ]; hematuria[  ];   dysuria [  ];  nocturia[  ];  history of     obstruction [  ]; urinary frequency [  ]             Skin: rash, swelling[  ];, hair loss[  ];  peripheral edema[  ];  or itching[  ]; Musculosketetal: myalgias[  ];  joint swelling[  ];  joint erythema[  ];  joint pain[  ];  back pain[  ];  Heme/Lymph: bruising[  ];  bleeding[  ];  anemia[  ];  Neuro: TIA[  ];  headaches[  ];  stroke[  ];  vertigo[  ];  seizures[  ];   paresthesias[  ];  difficulty walking[  ];  Psych:depression[  ]; anxiety[  ];  Endocrine: diabetes[  ];  thyroid dysfunction[  ];  Immunizations: Flu Blue.Reese  ]; Pneumococcal[n  ];  Other:  Physical Exam: BP (!) 147/92   Pulse 62   Resp 20   Ht 6' (1.829 m)   Wt 193 lb (87.5 kg)   SpO2 98% Comment: RA  BMI 26.18 kg/m   General appearance: alert, cooperative, appears stated age and no distress Neurologic: intact Heart: regular rate and rhythm, S1, S2 normal, no murmur, click,  rub or gallop and normal apical impulse Lungs: clear to auscultation bilaterally Abdomen: soft, non-tender; bowel sounds normal; no masses,  no organomegaly Extremities: extremities normal, atraumatic, no cyanosis or edema and Homans sign is negative, no sign of DVT Patient has full DP and PT pulses bilaterally, no carotid bruits bilaterally  Diagnostic Studies & Laboratory data:     Recent Radiology Findings:   Ct Angio Chest Aorta W/cm &/or Wo/cm  Result Date: 11/13/2016 CLINICAL DATA:  71 year old male with a history of thoracic aortic aneurysm EXAM: CT ANGIOGRAPHY CHEST WITH CONTRAST TECHNIQUE: Multidetector CT imaging of the chest was performed using the standard protocol during bolus administration of intravenous contrast. Multiplanar CT image reconstructions and MIPs were obtained to evaluate the vascular anatomy. CONTRAST:  75 cc Isovue 3 70 COMPARISON:  None. FINDINGS: Cardiovascular: Heart: No cardiomegaly. No pericardial fluid/thickening. Calcifications of the left anterior descending coronary artery Aorta: Diameter of the ascending thoracic aorta appears unchanged from the comparison CT. Measurement today, perpendicular to the flow channel on axial images, approximately 42 mm (image 64). Minimal calcifications of the thoracic aorta. Branch vessels are patent with a common origin of the innominate artery and the left common carotid artery. Pulmonary arteries: No central, lobar, segmental, or proximal subsegmental filling defects. Mediastinum/Nodes: No mediastinal adenopathy. Unremarkable appearance of the thoracic esophagus. Unremarkable appearance of the thoracic inlet and thyroid. Lungs/Pleura: Central airways are clear. No pleural effusion. No confluent left-sided airspace disease. No pneumothorax. Upper Abdomen: Unremarkable. Musculoskeletal: No displaced fracture. Minimal degenerative changes of the spine. Review of the MIP images confirms the above findings. IMPRESSION: No acute CT  finding. Unchanged diameter of the ascending aorta, with maximum measurement approximately 4.2 cm. Electronically Signed   By: Corrie Mckusick D.O.   On: 11/13/2016 11:23  I have independently reviewed the above radiology studies  and reviewed the findings with the patient.   RADIOLOGY REPORT*  Clinical Data: Evaluate ascending aortic aneurysm  CT ANGIOGRAPHY CHEST  Technique: Multidetector CT imaging of the chest using the  standard protocol during bolus administration of intravenous  contrast. Multiplanar reconstructed images including MIPs were  obtained and reviewed to evaluate the vascular anatomy.  Contrast: 130mL OMNIPAQUE IOHEXOL 350 MG/ML SOLN  Comparison: Chest CT - 03/12/2010; 03/24/2008  Vascular Findings:  There is grossly unchanged fusiform ectasia of the ascending  thoracic aorta with measurements as follows. The aorta tapers to a  normal caliber at the level of the aortic arch. 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 Normal caliber of the  descending thoracic aorta. No evidence of thoracic aortic  dissection. No periaortic stranding. Minimal calcifications within  the aortic valve annulus.  Sinotubular junction: 36 mm as measured in greatest  oblique coronal  dimension, unchanged.  Proximal ascending aorta: 42 x 44 mm as measured in greatest  oblique axial dimension at the level of the main pulmonary artery.  The ascending thoracic aorta measures approximately 44 mm in  greatest oblique coronal dimension. These measurements are grossly  unchanged since the 03/2008 examination  Aortic arch aorta: 26 mm as measured in greatest oblique sagittal  dimension.  Proximal descending thoracic aorta: 32 x 30 mm as measured in  greatest oblique axial dimension at the level of the main pulmonary  artery, unchanged.  Distal descending thoracic aorta: 26 x 25 mm as measured in  greatest oblique axial dimension at the level of the diaphragmatic  hiatus, unchanged.    Borderline cardiomegaly. Minimal coronary artery calcifications.  No pericardial effusion. Although this examination was not  tailored for evaluation of the pulmonary arteries, there are no  discrete filling defects within the central pulmonary arterial tree  to suggest pulmonary embolism. Normal caliber of the main  pulmonary artery.  ----------------------------------------------  Nonvascular findings:  Minimal dependent subpleural ground-glass atelectasis. No focal  airspace opacities. No pleural effusion or pneumothorax. The  central pulmonary airways are patent. The punctate granuloma in  the right middle lobe (image 43, series six). No mediastinal,  hilar or axillary lymphadenopathy.  Early arterial phase evaluation of the upper abdomen is  unremarkable.  No acute or aggressive osseous abnormalities.  IMPRESSION:  Unchanged fusiform ectasia of the ascending thoracic aorta  measuring approximately 44 mm in greatest oblique axial dimension,  grossly unchanged since the 03/2008 examination.  Original Report Authenticated By: Jake Seats, MD   Recent Lab Findings: Lab Results  Component Value Date   CREATININE 1.04 04/23/2015   BUN 13 04/24/2014   Aortic Size Index=       4.4  /Body surface area is 2.11 meters squared. =2.13  < 2.75 cm/m2      4% risk per year 2.75 to 4.25          8% risk per year > 4.25 cm/m2    20% risk per year     Assessment / Plan:   Patient is asymptomatic with stable appearing dilatation of the ascending aorta less than 4.2 cm in size, on exam he And do not hear any cardiac murmurs to suggest concomitant valve disease, patient's last echocardiogram was 2010  I recommended that we continue with good blood pressure control including beta blocker. I'll plan to see him back again in 18 months  with a followup CT scan of the chest. He was cautioned about doing excessive heavy lifting.   Patient was warned about not using Cipro and similar  antibiotics. Recent studies have raised concern that fluoroquinolone antibiotics could be associated with an increased risk of aortic aneurysm Fluoroquinolones have non-antimicrobial properties that might jeopardise the integrity of the extracellular matrix of the vascular wall In a  propensity score matched cohort study in [place], there was a 66% increased rate of aortic aneurysm or dissection associated with oral fluoroquinolone use, compared with amoxicillin use, within a 60 day risk period from start of treatment  According to the 2010 ACC/AHA guidelines, we recommend patients with thoracic aortic disease to maintain a LDL of less than 70 and a HDL of greater than 50. We recommend their blood pressure to remain less than 135/85.    Grace Isaac MD  Beeper [phone] Office [phone] 11/13/2016 12:17 PM

## 2016-11-13 NOTE — Patient Instructions (Signed)

## 2016-11-13 NOTE — Progress Notes (Signed)
Hmm, I have not seen him in two and a half years. Will make an appt and work on that BP. Dalene Seltzer, can you please make him a non-urgent appt? Chelley, please ask him to record his BP at home once a day, when sitting quietly for >10 minutes and send [place] the log. Thanks [employer]

## 2016-11-14 ENCOUNTER — Telehealth: Payer: Self-pay

## 2016-11-14 NOTE — Telephone Encounter (Signed)
-----   Message from [name], MD sent at 11/13/2016  1:08 PM EDT -----   ----- Message ----- From: Grace Isaac, MD Sent: 11/13/2016  12:33 PM To: [name], MD  Please see this recent information about our joint patient. Lilia Argue  Gerhardt MD [facility] [address]  [place] [address]        [phone] office        [phone] beeper

## 2016-11-14 NOTE — Telephone Encounter (Signed)
Progress Notes by [name], MD at 11/13/2016 11:30 AM   Author: [name], MD Author Type: Physician Filed: 11/13/2016 1:08 PM  Note Status: Signed Cosign: Cosign Not Required Encounter Date: 11/13/2016  Editor: [name], MD (Physician)    Hmm, I have not seen him in two and a half years. Will make an appt and work on that BP. Dalene Seltzer, can you please make him a non-urgent appt? Chelley, please ask him to record his BP at home once a day, when sitting quietly for >10 minutes and send [place] the log. Thanks [employer]

## 2016-11-14 NOTE — Telephone Encounter (Signed)
Advised patient of recommendations.  

## 2017-01-06 ENCOUNTER — Encounter: Payer: Self-pay | Admitting: Cardiovascular Disease

## 2017-01-06 ENCOUNTER — Ambulatory Visit (INDEPENDENT_AMBULATORY_CARE_PROVIDER_SITE_OTHER): Payer: Medicare Other | Admitting: Cardiovascular Disease

## 2017-01-06 VITALS — BP 126/72 | HR 69 | Ht 72.0 in | Wt 197.0 lb

## 2017-01-06 DIAGNOSIS — I10 Essential (primary) hypertension: Secondary | ICD-10-CM | POA: Diagnosis not present

## 2017-01-06 DIAGNOSIS — I77810 Thoracic aortic ectasia: Secondary | ICD-10-CM

## 2017-01-06 DIAGNOSIS — E782 Mixed hyperlipidemia: Secondary | ICD-10-CM | POA: Diagnosis not present

## 2017-01-06 DIAGNOSIS — R7303 Prediabetes: Secondary | ICD-10-CM | POA: Diagnosis not present

## 2017-01-06 MED ORDER — METOPROLOL SUCCINATE ER 25 MG PO TB24: 12.5000 mg | ORAL_TABLET | Freq: Every day | ORAL | 3 refills | Status: DC

## 2017-01-06 NOTE — Patient Instructions (Addendum)
Dr Sallyanne Kuster has recommended making the following medication changes: 1. STOP Metoprolol TARTRATE 2. START Metoprolol SUCCINATE 25 mg - take 0.5 tablet by mouth daily  Your physician recommends that you schedule a follow-up appointment in 12 months. You will receive a reminder letter in the mail two months in advance. If you don't receive a letter, please call our office to schedule the follow-up appointment.  If you need a refill on your cardiac medications before your next appointment, please call your pharmacy.

## 2017-01-06 NOTE — Progress Notes (Signed)
Cardiology Office Note:    Date:  01/06/2017   ID:  MARCEL GARY, DOB 11-05-1945, MRN [id number]  PCP:  [name], MD  Cardiologist:  [name], MD    Referring MD: [name], MD   chief complaint: F/u ascending aortic aneurysm, hypertension, hyperlipidemia  History of Present Illness:    Brian Gray is a 71 y.o. male with a hx of mild dilation of the ascending aorta (4.4 cm, stable for the last 7 years), history of false positive nuclear stress test leading to a coronary angiogram in 2012 that showed no significant stenoses, treated hyperlipidemia, hypertension and borderline diabetes mellitus, returning for his first follow-up visit in about 2-1/2 years.  He feels great and has no complaints whatsoever.  He is physically active. The patient specifically denies any chest pain at rest exertion, dyspnea at rest or with exertion, orthopnea, paroxysmal nocturnal dyspnea, syncope, palpitations, focal neurological deficits, intermittent claudication, lower extremity edema, unexplained weight gain, cough, hemoptysis or wheezing.  He is taking metoprolol tartrate only once a day.  He had labs done for recent insurance evaluation and reports his total cholesterol was 128 and his LDL was around 50.  His HDL is chronically low in the 30s.  His hemoglobin A1c had increased slightly to 6.4%.  Recent aortic CT angiogram shows no change in the site of the ascending aortic aneurysm.  Past Medical History:  Diagnosis Date  . Hyperlipidemia   . Hypertension     Past Surgical History:  Procedure Laterality Date  . CARDIAC CATHETERIZATION    . CARDIAC CATHETERIZATION  02/21/2011   Normal Coronary Arteries  . COLONOSCOPY  05/15/2011   Procedure: COLONOSCOPY;  Surgeon: [name], MD;  [facility];  Service: Endoscopy;  Laterality: N/A;  930  . DOPPLER ECHOCARDIOGRAPHY  03/20/2008   EF>55%  . LEFT HEART CATHETERIZATION WITH CORONARY ANGIOGRAM N/A 02/21/2011   Procedure:  LEFT HEART CATHETERIZATION WITH CORONARY ANGIOGRAM;  Surgeon: [name], MD;  Location: [facility];  Service: Cardiovascular;  Laterality: N/A;  . NM MYOVIEW LTD  02/04/2011   EF 58%    Current Medications: No outpatient prescriptions have been marked as taking for the 01/06/17 encounter (Office Visit) with [name], MD.     Allergies:   Patient has no known allergies.   Social History   Social History  . Marital status: Married    Spouse name: N/A  . Number of children: N/A  . Years of education: N/A   Social History Main Topics  . Smoking status: Never Smoker  . Smokeless tobacco: Never Used  . Alcohol use No  . Drug use: No  . Sexual activity: Yes    Partners: Female    Birth control/ protection: None   Other Topics Concern  . None   Social History Narrative  . None     Family History: The patient's family history is negative for Colon cancer.,  Aortic aneurysm/dissection or sudden death ROS:   Please see the history of present illness.     All other systems reviewed and are negative.  EKGs/Labs/Other Studies Reviewed:    The following studies were reviewed today: CT images and notes of recent visit with Dr. [name]  EKG:  EKG is  ordered today.  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  Recent Labs: Globin A1c 5.6% Recent Lipid Panel Per his recollection total cholesterol 128, HDL in 30s, LDL around 50  Physical Exam:  VS:  BP 126/72   Pulse 69   Ht 6' (1.829 m)   Wt 197 lb (89.4 kg)   SpO2 97%   BMI 26.72 kg/m     Wt Readings from Last 3 Encounters:  01/06/17 197 lb (89.4 kg)  11/13/16 193 lb (87.5 kg)  04/26/15 195 lb (88.5 kg)     GEN:  Well nourished, well developed in no acute distress HEENT: Normal NECK: No JVD; No carotid bruits LYMPHATICS: No lymphadenopathy CARDIAC: RRR, no murmurs, rubs, gallops RESPIRATORY:  Clear to auscultation without rales, wheezing or rhonchi  ABDOMEN:  Soft, non-tender, non-distended MUSCULOSKELETAL:  No edema; No deformity  SKIN: Warm and dry NEUROLOGIC:  Alert and oriented x 3 PSYCHIATRIC:  Normal affect   ASSESSMENT:    1. Ascending aorta dilatation (HCC)   2. Essential hypertension   3. Mixed hyperlipidemia   4. Prediabetes    PLAN:    In order of problems listed above:  1. AATA: Stable in size for the last 7-8 years.  Asymptomatic.  Follow-up with Dr. [name] in 18 months 2. HTN: Controlled.  Change to 24-hour acting metoprolol succinate rather than once daily with metoprolol tartrate 3. HLP: DL in target.  Encouraged more physical exercise to bring up the HDL cholesterol.  Avoid isometric intense exercise, but will benefit from moderate aerobic activity. 4. PreDM: A1c acceptable, lower than it was in 2015.  No reason to change medications, but encouraged more physical activity.   Medication Adjustments/Labs and Tests Ordered: Current medicines are reviewed at length with the patient today.  Concerns regarding medicines are outlined above.  Orders Placed This Encounter  Procedures  . EKG 12-Lead   Meds ordered this encounter  Medications  . metoprolol succinate (TOPROL-XL) 25 MG 24 hr tablet    Sig: Take 0.5 tablets (12.5 mg total) by mouth daily.    Dispense:  45 tablet    Refill:  3    Signed, [name], MD  01/06/2017 10:28 AM    [facility]

## 2017-02-23 ENCOUNTER — Other Ambulatory Visit: Payer: Self-pay

## 2017-02-23 MED ORDER — METOPROLOL SUCCINATE ER 25 MG PO TB24: 12.5000 mg | ORAL_TABLET | Freq: Every day | ORAL | 3 refills | Status: DC

## 2017-02-23 NOTE — Telephone Encounter (Signed)
Rx(s) sent to pharmacy electronically.  

## 2018-01-05 ENCOUNTER — Other Ambulatory Visit: Payer: Self-pay | Admitting: Cardiovascular Disease

## 2018-01-12 ENCOUNTER — Ambulatory Visit: Payer: Medicare Other | Admitting: Cardiovascular Disease

## 2018-01-12 ENCOUNTER — Encounter: Payer: Self-pay | Admitting: Cardiovascular Disease

## 2018-01-12 VITALS — BP 132/80 | HR 69 | Ht 71.0 in | Wt 194.6 lb

## 2018-01-12 DIAGNOSIS — I712 Thoracic aortic aneurysm, without rupture: Secondary | ICD-10-CM

## 2018-01-12 DIAGNOSIS — E7800 Pure hypercholesterolemia, unspecified: Secondary | ICD-10-CM

## 2018-01-12 DIAGNOSIS — E119 Type 2 diabetes mellitus without complications: Secondary | ICD-10-CM | POA: Diagnosis not present

## 2018-01-12 DIAGNOSIS — I10 Essential (primary) hypertension: Secondary | ICD-10-CM

## 2018-01-12 DIAGNOSIS — I7121 Aneurysm of the ascending aorta, without rupture: Secondary | ICD-10-CM

## 2018-01-12 NOTE — Patient Instructions (Signed)
Medication Instructions:  Dr Sallyanne Kuster recommends that you continue on your current medications as directed. Please refer to the Current Medication list given to you today.  Follow-Up: At [facility], you and your health needs are our priority.  As part of our continuing mission to provide you with exceptional heart care, we have created designated Provider Care Teams.  These Care Teams include your primary Cardiologist (physician) and Advanced Practice Providers (APPs -  Physician Assistants and Nurse Practitioners) who all work together to provide you with the care you need, when you need it. You will need a follow up appointment in 12 months.  Please call our office 2 months in advance to schedule this appointment.  You may see [name], MD or one of the following Advanced Practice Providers on your designated Care Team: [place], [place] . Fabian Sharp, PA-C

## 2018-01-12 NOTE — Progress Notes (Signed)
Cardiology Office Note:    Date:  01/17/2018   ID:  Brian Gray, DOB 1945/04/14, MRN [id number]  PCP:  [name], MD  Cardiologist:  [name], MD    Referring MD: [name], MD   chief complaint: F/u ascending aortic aneurysm, hypertension, hyperlipidemia  History of Present Illness:    Brian Gray is a 72 y.o. male with a hx of mild dilation of the ascending aorta (4.4 cm, stable since 2010, most recent CTA 2018 when he saw Dr. [name]), history of false positive nuclear stress test leading to a coronary angiogram in 2012 that showed no significant stenoses, treated hyperlipidemia, hypertension and borderline diabetes mellitus, returning for follow-up.  He feels great.  He does not have any cardiovascular complaints.  He frequently checks his blood pressure at home and is consistently 130/75 or less.  His blood pressure was a little higher when he first checked in today at 142/76, but when rechecked was 132/80.  The patient specifically denies any chest pain at rest exertion, dyspnea at rest or with exertion, orthopnea, paroxysmal nocturnal dyspnea, syncope, palpitations, focal neurological deficits, intermittent claudication, lower extremity edema, unexplained weight gain, cough, hemoptysis or wheezing.  He recently had labs and his cholesterol was "okay".  He is compliant with his statin he thinks his his hemoglobin A1c was 7.1% which is a slight deterioration in places him fully in the category of diabetes mellitus.  Past Medical History:  Diagnosis Date  . Hyperlipidemia   . Hypertension     Past Surgical History:  Procedure Laterality Date  . CARDIAC CATHETERIZATION    . CARDIAC CATHETERIZATION  02/21/2011   Normal Coronary Arteries  . COLONOSCOPY  05/15/2011   Procedure: COLONOSCOPY;  Surgeon: [name], MD;  [facility];  Service: Endoscopy;  Laterality: N/A;  930  . DOPPLER ECHOCARDIOGRAPHY  03/20/2008   EF>55%  . LEFT HEART  CATHETERIZATION WITH CORONARY ANGIOGRAM N/A 02/21/2011   Procedure: LEFT HEART CATHETERIZATION WITH CORONARY ANGIOGRAM;  Surgeon: [name], MD;  Location: [facility];  Service: Cardiovascular;  Laterality: N/A;  . NM MYOVIEW LTD  02/04/2011   EF 58%    Current Medications: Current Meds  Medication Sig  . aspirin EC 81 MG tablet Take 81 mg by mouth daily.    Marland Kitchen atorvastatin (LIPITOR) 40 MG tablet Take 1 tablet by mouth daily.  Marland Kitchen CINNAMON PO Take 1,000 mg by mouth daily.   . hydrochlorothiazide (HYDRODIURIL) 12.5 MG tablet Take 12.5 mg by mouth daily.    Marland Kitchen lisinopril (PRINIVIL,ZESTRIL) 10 MG tablet Take 10 mg by mouth daily.  . metFORMIN (GLUCOPHAGE) 500 MG tablet Take by mouth daily with breakfast.  . metoprolol succinate (TOPROL-XL) 25 MG 24 hr tablet TAKE ONE-HALF TABLET BY  MOUTH DAILY  . naproxen sodium (ANAPROX) 220 MG tablet Take 220 mg by mouth every 8 (eight) hours as needed. For pain  . potassium chloride (MICRO-K) 10 MEQ CR capsule Take 10 mEq by mouth daily as needed.  . vitamin B-12 (CYANOCOBALAMIN) 1000 MCG tablet Take 1,000 mcg by mouth daily.       Allergies:   Patient has no known allergies.   Social History   Socioeconomic History  . Marital status: Married    Spouse name: Not on file  . Number of children: Not on file  . Years of education: Not on file  . Highest education level: Not on file  Occupational History  . Not on file  Social Needs  .  Financial resource strain: Not on file  . Food insecurity:    Worry: Not on file    Inability: Not on file  . Transportation needs:    Medical: Not on file    Non-medical: Not on file  Tobacco Use  . Smoking status: Never Smoker  . Smokeless tobacco: Never Used  Substance and Sexual Activity  . Alcohol use: No  . Drug use: No  . Sexual activity: Yes    Partners: Female    Birth control/protection: None  Lifestyle  . Physical activity:    Days per week: Not on file    Minutes per session: Not on file    . Stress: Not on file  Relationships  . Social connections:    Talks on phone: Not on file    Gets together: Not on file    Attends religious service: Not on file    Active member of club or organization: Not on file    Attends meetings of clubs or organizations: Not on file    Relationship status: Not on file  Other Topics Concern  . Not on file  Social History Narrative  . Not on file     Family History: The patient's family history is negative for Colon cancer.,  Aortic aneurysm/dissection or sudden death ROS:   Please see the history of present illness.    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, QTC 469 ms, no other repolarization abnormalities  Recent Labs: Globin A1c 7.1% Recent Lipid Panel Per his recollection total cholesterol 128, HDL in 30s, LDL around 50  Physical Exam:    VS:  BP 132/80   Pulse 69   Ht 5\' 11"  (1.803 m)   Wt 194 lb 9.6 oz (88.3 kg)   BMI 27.14 kg/m     Wt Readings from Last 3 Encounters:  01/12/18 194 lb 9.6 oz (88.3 kg)  01/06/17 197 lb (89.4 kg)  11/13/16 193 lb (87.5 kg)     GEN:  Well nourished, well developed in no acute distress HEENT: Normal NECK: No JVD; No carotid bruits LYMPHATICS: No lymphadenopathy CARDIAC: RRR, no murmurs, rubs, gallops RESPIRATORY:  Clear to auscultation without rales, wheezing or rhonchi  ABDOMEN: Soft, non-tender, non-distended MUSCULOSKELETAL:  No edema; No deformity  SKIN: Warm and dry NEUROLOGIC:  Alert and oriented x 3 PSYCHIATRIC:  Normal affect   ASSESSMENT:    1. Ascending aortic aneurysm ([place])   2. Essential hypertension   3. Hypercholesterolemia   4. Diabetes mellitus type 2 in nonobese [facility])    PLAN:    In order of problems listed above:  1. AATA: Stable in size since 2010 asymptomatic.  Follow-up  with Dr. [name] early 2020. 2. HTN: Reports good control. 3. HLP: Need to get his lipid profile results. 4. DM: A1c acceptable, but if his recollection it was 7.1% is correct, he now has full-blown diabetes.  He is taking metformin.  He can try to lose a few more pounds and BMI down to 25, waistline down to 34 inches.  Discussed healthy changes in diet.  Increase aerobic physical activity but avoid weight lifting.   Medication Adjustments/Labs and Tests Ordered: Current medicines are reviewed at length with the patient today.  Concerns regarding medicines are outlined above.  Orders Placed This Encounter  Procedures  .  EKG 12-Lead   No orders of the defined types were placed in this encounter.   Signed, [name], MD  01/17/2018 10:30 AM    [facility]

## 2018-01-17 DIAGNOSIS — E119 Type 2 diabetes mellitus without complications: Secondary | ICD-10-CM | POA: Insufficient documentation

## 2018-04-07 ENCOUNTER — Other Ambulatory Visit: Payer: Self-pay | Admitting: Cardiothoracic Surgery

## 2018-04-07 DIAGNOSIS — I712 Thoracic aortic aneurysm, without rupture, unspecified: Secondary | ICD-10-CM

## 2018-05-20 ENCOUNTER — Ambulatory Visit: Payer: Medicare Other | Admitting: Cardiothoracic Surgery

## 2018-05-20 ENCOUNTER — Ambulatory Visit
Admission: RE | Admit: 2018-05-20 | Discharge: 2018-05-20 | Disposition: A | Discharge status: Home / Self Care | Payer: Medicare Other | Source: Ambulatory Visit | Attending: Cardiothoracic Surgery | Admitting: Cardiothoracic Surgery

## 2018-05-20 ENCOUNTER — Other Ambulatory Visit: Payer: Self-pay

## 2018-05-20 VITALS — BP 122/78 | HR 76 | Resp 20 | Ht 71.0 in | Wt 198.0 lb

## 2018-05-20 DIAGNOSIS — I712 Thoracic aortic aneurysm, without rupture, unspecified: Secondary | ICD-10-CM

## 2018-05-20 DIAGNOSIS — I77810 Thoracic aortic ectasia: Secondary | ICD-10-CM | POA: Diagnosis not present

## 2018-05-20 MED ORDER — IOPAMIDOL (ISOVUE-370) INJECTION 76%
75.0000 mL | Freq: Once | INTRAVENOUS | Status: AC | PRN
  Administered 2018-05-20: 75 mL via INTRAVENOUS

## 2018-05-20 NOTE — Patient Instructions (Signed)

## 2018-05-20 NOTE — Progress Notes (Signed)
Thanks, Ed

## 2018-05-20 NOTE — Progress Notes (Signed)
[place] Record #[id number] Date of Birth: 10-07-1945  Referring: [name], MD Primary Care: [name], MD  Chief Complaint:    Chief Complaint  Patient presents with  . Thoracic Aortic Aneurysm    18 month f/u with Chest CTA    History of Present Illness:    Patient is a 73 year old male followed in the office for mildly dilated ascending aorta.  I originally saw him in February 2011 for a dilated acending aorta at that time there was approximately 4.4 cm in size and had been known about for 4-5 years. He has had  a cardiac catheterization 02/21/11 that showed no significant stenosis.  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  The patient denies significant shortness of breath dyspnea on exertion or chest discomfort. For the past year is been helping his son build a new garage, he remains active without anginal chest pain  He has no family history of abnormal aorta aortic aneurysms or sudden death from unknown cause.  Current Activity/ Functional Status:  Patient is independent with mobility/ambulation, transfers, ADL's, IADL's.  Zubrod Score: At the time of surgery this patient's most appropriate activity status/level should be described as: [x]  Normal activity, no symptoms []  Symptoms, fully ambulatory []  Symptoms, in bed less than or equal to 50% of the time []  Symptoms, in bed greater than 50% of the time but less than 100% []  Bedridden []  Moribund   Past Medical History:  Diagnosis Date  . Hyperlipidemia   . Hypertension     Past Surgical History:  Procedure Laterality Date  . CARDIAC CATHETERIZATION    . CARDIAC CATHETERIZATION  02/21/2011   Normal Coronary Arteries  . COLONOSCOPY  05/15/2011   Procedure: COLONOSCOPY;  Surgeon: [name], MD;  Location: [facility];  Service: Endoscopy;  Laterality: N/A;  930  . DOPPLER ECHOCARDIOGRAPHY  03/20/2008   EF>55%  . LEFT HEART  CATHETERIZATION WITH CORONARY ANGIOGRAM N/A 02/21/2011   Procedure: LEFT HEART CATHETERIZATION WITH CORONARY ANGIOGRAM;  Surgeon: [name], MD;  Location: [facility];  Service: Cardiovascular;  Laterality: N/A;  . NM MYOVIEW LTD  02/04/2011   EF 58%    Family History  Problem Relation Age of Onset  . Colon cancer Neg Hx   father died ruptured stomach at age 98 ulcer , three brothers have has heart surgery no history dissection  Social History   Socioeconomic History  . Marital status: Married    Spouse name: Not on file  . Number of children: Not on file  . Years of education: Not on file  . Highest education level: Not on file  Occupational History  . Not on file  Social Needs  . Financial resource strain: Not on file  . Food insecurity:    Worry: Not on file    Inability: Not on file  . Transportation needs:    Medical: Not on file    Non-medical: Not on file  Tobacco Use  . Smoking status: Never Smoker  . Smokeless tobacco: Never Used  Substance and Sexual Activity  . Alcohol use: No  . Drug use: No  . Sexual activity: Yes    Partners: Female    Birth control/protection: None  Lifestyle  . Physical activity:    Days per week: Not on file    Minutes per session: Not on file  .  Stress: Not on file  Relationships  . Social connections:    Talks on phone: Not on file    Gets together: Not on file    Attends religious service: Not on file    Active member of club or organization: Not on file    Attends meetings of clubs or organizations: Not on file    Relationship status: Not on file  . Intimate partner violence:    Fear of current or ex partner: Not on file    Emotionally abused: Not on file    Physically abused: Not on file    Forced sexual activity: Not on file  Other Topics Concern  . Not on file  Social History Narrative  . Not on file    Social History   Tobacco Use  Smoking Status Never Smoker  Smokeless Tobacco Never Used    Social  History   Substance and Sexual Activity  Alcohol Use No     No Known Allergies  Current Outpatient Medications  Medication Sig Dispense Refill  . aspirin EC 81 MG tablet Take 81 mg by mouth daily.      Marland Kitchen atorvastatin (LIPITOR) 40 MG tablet Take 1 tablet by mouth daily.  3  . CINNAMON PO Take 1,000 mg by mouth daily.     . hydrochlorothiazide (HYDRODIURIL) 12.5 MG tablet Take 12.5 mg by mouth daily.      Marland Kitchen lisinopril (PRINIVIL,ZESTRIL) 10 MG tablet Take 10 mg by mouth daily.    . metFORMIN (GLUCOPHAGE) 500 MG tablet Take by mouth daily with breakfast.    . metoprolol succinate (TOPROL-XL) 25 MG 24 hr tablet TAKE ONE-HALF TABLET BY  MOUTH DAILY 45 tablet 3  . naproxen sodium (ANAPROX) 220 MG tablet Take 220 mg by mouth every 8 (eight) hours as needed. For pain    . potassium chloride (MICRO-K) 10 MEQ CR capsule Take 10 mEq by mouth daily as needed.    . vitamin B-12 (CYANOCOBALAMIN) 1000 MCG tablet Take 1,000 mcg by mouth daily.       No current facility-administered medications for this visit.        Review of Systems:     Cardiac Review of Systems: Y or N  Chest Pain [   N]  Resting SOB [  N] Exertional SOB  [ N]  Orthopnea [  ]   Pedal Edema [ N]    Palpitations [N] Syncope  [ N]   Presyncope Aqua.Slicker  ]  General Review of Systems: [Y] = yes [  ]=no Constitional: recent weight change [  ]; anorexia [  ]; fatigue [ n ]; nausea [  ]; night sweats [  ]; fever [  ]; or chills [  ];  Dental: poor dentition[  ]; Last Dentist visit: twice a year  Eye : blurred vision [  ]; diplopia [   ]; vision changes [  ];  Amaurosis fugax[  ]; Resp: cough [  ];  wheezing[  ];  hemoptysis[  ]; shortness of breath[  ]; paroxysmal nocturnal dyspnea[  ]; dyspnea on exertion[  ]; or orthopnea[  ];  GI:  gallstones[  ], vomiting[  ];  dysphagia[  ]; melena[  ];  hematochezia  [  ]; heartburn[  ];   Hx of  Colonoscopy[ y ]; GU: kidney stones [  ]; hematuria[  ];   dysuria [  ];  nocturia[  ];  history of     obstruction [  ]; urinary frequency [  ]             Skin: rash, swelling[  ];, hair loss[  ];  peripheral edema[  ];  or itching[  ]; Musculosketetal: myalgias[  ];  joint swelling[  ];  joint erythema[  ];  joint pain[  ];  back pain[  ];  Heme/Lymph: bruising[  ];  bleeding[  ];  anemia[  ];  Neuro: TIA[  ];  headaches[  ];  stroke[  ];  vertigo[  ];  seizures[  ];   paresthesias[  ];  difficulty walking[  ];  Psych:depression[  ]; anxiety[  ];  Endocrine: diabetes[  ];  thyroid dysfunction[  ];  Immunizations: Flu Jazmín.Cullens ]; Pneumococcal[N ];  Other:  Physical Exam: BP 122/78   Pulse 76   Resp 20   Ht 5\' 11"  (1.803 m)   Wt 198 lb (89.8 kg)   SpO2 96% Comment: RA  BMI 27.62 kg/m   General appearance: alert, cooperative and no distress Head: Normocephalic, without obvious abnormality, atraumatic Neck: no adenopathy, no carotid bruit, no JVD, supple, symmetrical, trachea midline and thyroid not enlarged, symmetric, no tenderness/mass/nodules Lymph nodes: Cervical, supraclavicular, and axillary nodes normal. Resp: clear to auscultation bilaterally Back: symmetric, no curvature. ROM normal. No CVA tenderness. Cardio: regular rate and rhythm, S1, S2 normal, no murmur, click, rub or gallop GI: soft, non-tender; bowel sounds normal; no masses,  no organomegaly Extremities: extremities normal, atraumatic, no cyanosis or edema and Homans sign is negative, no sign of DVT Neurologic: Grossly normal  Palpable DP and PT pulses bilaterally No palpable popliteal or abdominal aneurysm appreciated     Diagnostic Studies & Laboratory data:     Recent Radiology Findings:   Ct Angio Chest Aorta W/cm &/or Wo/cm  Result Date: 05/20/2018 CLINICAL DATA:  Follow-up TAA EXAM: CT ANGIOGRAPHY CHEST WITH CONTRAST TECHNIQUE: Multidetector CT imaging of the chest was  performed using the standard protocol during bolus administration of intravenous contrast. Multiplanar CT image reconstructions and MIPs were obtained to evaluate the vascular anatomy. CONTRAST:  82mL ISOVUE-370 IOPAMIDOL (ISOVUE-370) INJECTION 76% COMPARISON:  11/13/2016 FINDINGS: Cardiovascular: No significant change in enlargement of the tubular ascending thoracic aorta measuring 4.3 x 4.2 cm. Normal caliber of the descending thoracic aorta. Minimal scattered atherosclerosis. Left coronary artery calcifications. Mediastinum/Nodes: No enlarged mediastinal, hilar, or axillary lymph nodes. Thyroid gland, trachea, and esophagus demonstrate no significant findings. Lungs/Pleura: Lungs are clear. No pleural effusion or pneumothorax. Upper Abdomen: No acute abnormality. Musculoskeletal: No chest wall abnormality. No acute or significant osseous findings. Review of the MIP images confirms the above findings. IMPRESSION: No significant change in enlargement of the tubular ascending thoracic aorta measuring 4.3 x 4.2 cm. Electronically Signed  By: Eddie Candle M.D.   On: 05/20/2018 10:38    I have independently reviewed the above radiology studies  and reviewed the findings with the patient.    RADIOLOGY REPORT*  Clinical Data: Evaluate ascending aortic aneurysm  CT ANGIOGRAPHY CHEST  Technique: Multidetector CT imaging of the chest using the  standard protocol during bolus administration of intravenous  contrast. Multiplanar reconstructed images including MIPs were  obtained and reviewed to evaluate the vascular anatomy.  Contrast: 165mL OMNIPAQUE IOHEXOL 350 MG/ML SOLN  Comparison: Chest CT - 03/12/2010; 03/24/2008  Vascular Findings:  There is grossly unchanged fusiform ectasia of the ascending  thoracic aorta with measurements as follows. The aorta tapers to a  normal caliber at the level of the aortic arch. 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 Normal caliber of the  descending  thoracic aorta. No evidence of thoracic aortic  dissection. No periaortic stranding. Minimal calcifications within  the aortic valve annulus.  Sinotubular junction: 36 mm as measured in greatest oblique coronal  dimension, unchanged.  Proximal ascending aorta: 42 x 44 mm as measured in greatest  oblique axial dimension at the level of the main pulmonary artery.  The ascending thoracic aorta measures approximately 44 mm in  greatest oblique coronal dimension. These measurements are grossly  unchanged since the 03/2008 examination  Aortic arch aorta: 26 mm as measured in greatest oblique sagittal  dimension.  Proximal descending thoracic aorta: 32 x 30 mm as measured in  greatest oblique axial dimension at the level of the main pulmonary  artery, unchanged.  Distal descending thoracic aorta: 26 x 25 mm as measured in  greatest oblique axial dimension at the level of the diaphragmatic  hiatus, unchanged.  Borderline cardiomegaly. Minimal coronary artery calcifications.  No pericardial effusion. Although this examination was not  tailored for evaluation of the pulmonary arteries, there are no  discrete filling defects within the central pulmonary arterial tree  to suggest pulmonary embolism. Normal caliber of the main  pulmonary artery.  ----------------------------------------------  Nonvascular findings:  Minimal dependent subpleural ground-glass atelectasis. No focal  airspace opacities. No pleural effusion or pneumothorax. The  central pulmonary airways are patent. The punctate granuloma in  the right middle lobe (image 43, series six). No mediastinal,  hilar or axillary lymphadenopathy.  Early arterial phase evaluation of the upper abdomen is  unremarkable.  No acute or aggressive osseous abnormalities.  IMPRESSION:  Unchanged fusiform ectasia of the ascending thoracic aorta  measuring approximately 44 mm in greatest oblique axial dimension,  grossly unchanged since the 03/2008  examination.  Original Report Authenticated By: Jake Seats, MD   Recent Lab Findings: Lab Results  Component Value Date   CREATININE 1.04 04/23/2015   BUN 13 04/24/2014   Aortic Size Index=       4.4  /Body surface area is 2.12 meters squared. =2.13  < 2.75 cm/m2      4% risk per year 2.75 to 4.25          8% risk per year > 4.25 cm/m2    20% risk per year     Assessment / Plan:   Patient is asymptomatic with stable appearing dilatation of the ascending aorta less than 4.3cm stable in size, in the setting of a trileaflet aortic valve.  Patient has no signs or symptoms of angina or congestive heart failure.  Negative family history for aortic dissection.   I discussed with the patient avoiding quinolones and also strenuous lifting.  He is aware of the need to maintain good blood pressure control.  Plan to see him back in 18 months with a follow-up CTA of the chest    According to the 2010 ACC/AHA guidelines, we recommend patients with thoracic aortic disease to maintain a LDL of less than 70 and a HDL of greater than 50. We recommend their blood pressure to remain less than 135/85.    Grace Isaac MD  Beeper [phone] Office [phone] 05/20/2018 11:32 AM

## 2018-06-09 ENCOUNTER — Observation Stay (HOSPITAL_COMMUNITY)
Admission: EM | Admit: 2018-06-09 | Discharge: 2018-06-10 | Disposition: A | Discharge status: Home / Self Care | Payer: Medicare Other | Attending: Internal Medicine | Admitting: Internal Medicine

## 2018-06-09 ENCOUNTER — Emergency Department (HOSPITAL_COMMUNITY): Payer: Medicare Other

## 2018-06-09 ENCOUNTER — Other Ambulatory Visit: Payer: Self-pay

## 2018-06-09 DIAGNOSIS — E785 Hyperlipidemia, unspecified: Secondary | ICD-10-CM | POA: Insufficient documentation

## 2018-06-09 DIAGNOSIS — Z7984 Long term (current) use of oral hypoglycemic drugs: Secondary | ICD-10-CM | POA: Diagnosis not present

## 2018-06-09 DIAGNOSIS — Z8679 Personal history of other diseases of the circulatory system: Secondary | ICD-10-CM | POA: Diagnosis not present

## 2018-06-09 DIAGNOSIS — R0789 Other chest pain: Principal | ICD-10-CM | POA: Insufficient documentation

## 2018-06-09 DIAGNOSIS — Z23 Encounter for immunization: Secondary | ICD-10-CM | POA: Insufficient documentation

## 2018-06-09 DIAGNOSIS — R079 Chest pain, unspecified: Secondary | ICD-10-CM | POA: Diagnosis present

## 2018-06-09 DIAGNOSIS — I10 Essential (primary) hypertension: Secondary | ICD-10-CM | POA: Diagnosis not present

## 2018-06-09 DIAGNOSIS — K219 Gastro-esophageal reflux disease without esophagitis: Secondary | ICD-10-CM | POA: Diagnosis not present

## 2018-06-09 DIAGNOSIS — E119 Type 2 diabetes mellitus without complications: Secondary | ICD-10-CM | POA: Diagnosis not present

## 2018-06-09 DIAGNOSIS — R072 Precordial pain: Secondary | ICD-10-CM

## 2018-06-09 DIAGNOSIS — Z7982 Long term (current) use of aspirin: Secondary | ICD-10-CM | POA: Insufficient documentation

## 2018-06-09 DIAGNOSIS — Z79899 Other long term (current) drug therapy: Secondary | ICD-10-CM | POA: Insufficient documentation

## 2018-06-09 LAB — CBC
HCT: 40.9 % (ref 39.0–52.0)
Hemoglobin: 13.6 g/dL (ref 13.0–17.0)
MCH: 30.5 pg (ref 26.0–34.0)
MCHC: 33.3 g/dL (ref 30.0–36.0)
MCV: 91.7 fL (ref 80.0–100.0)
Platelets: 211 10*3/uL (ref 150–400)
RBC: 4.46 MIL/uL (ref 4.22–5.81)
RDW: 12.4 % (ref 11.5–15.5)
WBC: 7.1 10*3/uL (ref 4.0–10.5)
nRBC: 0 % (ref 0.0–0.2)

## 2018-06-09 MED ORDER — ASPIRIN 81 MG PO CHEW
324.0000 mg | CHEWABLE_TABLET | Freq: Once | ORAL | Status: AC
  Administered 2018-06-10: 324 mg via ORAL
  Filled 2018-06-09: qty 4

## 2018-06-09 NOTE — ED Triage Notes (Signed)
Patient states chest pain that started 2 hours ago . Patient states he took Tums at home thinking it was indigestion. Patient states pain is in center of chest. Patient states pain is radiating under his rib cage. Patient does have a history of an aneurysm.

## 2018-06-09 NOTE — ED Provider Notes (Signed)
[facility] EMERGENCY DEPARTMENT Provider Note   CSN: [id number] Arrival date & time: 06/09/18  2323    History   Chief Complaint Chief Complaint  Patient presents with  . Chest Pain    HPI Brian Gray is a 73 y.o. male.     Patient presents with chest pain.  States he was resting at home watching television and developed some pain in the center of his chest that felt like a pressure.  This pain did not radiate and there is no associated shortness of breath, nausea, vomiting, diaphoresis.  Patient states his pain improved after belching after about 5 minutes.  He did felt well for about an hour.  While he was getting ready for bed he developed pressure in the center of his chest again and this time it radiated to his neck and his back.  He took some Tums thinking this was indigestion and began to feel better about 15 minutes.  He now feels back to baseline and has no pain.  He states when he checked his blood pressure at home while he was having the pain it was 409 systolic he was told by the nurse line to come to the hospital.  States compliance with his blood pressure medications. Patient does have a known thoracic aortic aneurysm which was stable on the last check on March 12.  He denies any history of CAD had a negative catheterization in 2012.  Does have a history of hypertension, hyperlipidemia and diabetes.  Patient has had similar episodes of pain in the past that usually relieved with belching but the episode tonight was not did have new radiation to his back and his neck which concerned him as well as with the elevated blood pressure.  The history is provided by the patient.  Chest Pain  Associated symptoms: back pain   Associated symptoms: no abdominal pain, no dizziness, no fever, no headache, no nausea, no shortness of breath, no vomiting and no weakness     Past Medical History:  Diagnosis Date  . Hyperlipidemia   . Hypertension     Patient Active Problem List    Diagnosis Date Noted  . Diabetes mellitus type 2 in nonobese ([place]) 01/17/2018  . Prediabetes 01/06/2017  . Hypercholesterolemia 01/19/2013  . 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 Hypertension 04/29/2012  . Ascending aorta dilatation ([facility]) 04/29/2012    Past Surgical History:  Procedure Laterality Date  . CARDIAC CATHETERIZATION    . CARDIAC CATHETERIZATION  02/21/2011   Normal Coronary Arteries  . COLONOSCOPY  05/15/2011   Procedure: COLONOSCOPY;  Surgeon: [name], MD;  Location: [facility];  Service: Endoscopy;  Laterality: N/A;  930  . DOPPLER ECHOCARDIOGRAPHY  03/20/2008   EF>55%  . LEFT HEART CATHETERIZATION WITH CORONARY ANGIOGRAM N/A 02/21/2011   Procedure: LEFT HEART CATHETERIZATION WITH CORONARY ANGIOGRAM;  Surgeon: [name], MD;  Location: [facility] CATH [facility];  Service: Cardiovascular;  Laterality: N/A;  . NM MYOVIEW LTD  02/04/2011   EF 58%        Home Medications    Prior to Admission medications   Medication Sig Start Date End Date Taking? Authorizing Provider  aspirin EC 81 MG tablet Take 81 mg by mouth daily.      [provider]  atorvastatin (LIPITOR) 40 MG tablet Take 1 tablet by mouth daily. 03/17/14   [provider]  CINNAMON PO Take 1,000 mg by mouth daily.     [provider]  hydrochlorothiazide (HYDRODIURIL) 12.5 MG tablet Take 12.5 mg by mouth daily.      [provider]  lisinopril (PRINIVIL,ZESTRIL) 10 MG tablet Take 10 mg by mouth daily.    [provider]  metFORMIN (GLUCOPHAGE) 500 MG tablet Take by mouth daily with breakfast.    [provider]  metoprolol succinate (TOPROL-XL) 25 MG 24 hr tablet TAKE ONE-HALF TABLET BY  MOUTH DAILY 01/05/18   Croitoru, Mihai, MD  naproxen sodium (ANAPROX) 220 MG tablet Take 220 mg by mouth every 8 (eight) hours as needed. For pain    [provider]  potassium chloride (MICRO-K) 10 MEQ CR capsule Take 10 mEq by mouth daily as  needed.    [provider]  vitamin B-12 (CYANOCOBALAMIN) 1000 MCG tablet Take 1,000 mcg by mouth daily.      [provider]    Family History Family History  Problem Relation Age of Onset  . Colon cancer Neg Hx     Social History Social History   Tobacco Use  . Smoking status: Never Smoker  . Smokeless tobacco: Never Used  Substance Use Topics  . Alcohol use: No  . Drug use: No     Allergies   Quinolones   Review of Systems Review of Systems  Constitutional: Negative for activity change, appetite change and fever.  HENT: Negative for congestion and rhinorrhea.   Eyes: Negative for visual disturbance.  Respiratory: Positive for chest tightness. Negative for shortness of breath.   Cardiovascular: Positive for chest pain.  Gastrointestinal: Negative for abdominal pain, nausea and vomiting.  Genitourinary: Negative for dysuria and hematuria.  Musculoskeletal: Positive for back pain. Negative for arthralgias and myalgias.  Skin: Negative for rash.  Neurological: Negative for dizziness, weakness and headaches.   all other systems are negative except as noted in the HPI and PMH.     Physical Exam Updated Vital Signs BP (!) 169/91 (BP Location: Left Arm)   Pulse 76   Temp 97.8 F (36.6 C) (Oral)   Resp 17   Ht 5\' 11"  (1.803 m)   Wt 84.8 kg   SpO2 99%   BMI 26.08 kg/m   Physical Exam Vitals signs and nursing note reviewed.  Constitutional:      General: He is not in acute distress.    Appearance: He is well-developed.  HENT:     Head: Normocephalic and atraumatic.     Mouth/Throat:     Pharynx: No oropharyngeal exudate.  Eyes:     Conjunctiva/sclera: Conjunctivae normal.     Pupils: Pupils are equal, round, and reactive to light.  Neck:     Musculoskeletal: Normal range of motion and neck supple.     Comments: No meningismus. Cardiovascular:     Rate and Rhythm: Normal rate and regular rhythm.     Heart sounds: Normal heart sounds. No  murmur.     Comments: Equal femoral and DP pulses bilaterally Equal radial pulses bilaterally Pulmonary:     Effort: Pulmonary effort is normal. No respiratory distress.     Breath sounds: Normal breath sounds.  Abdominal:     Palpations: Abdomen is soft.     Tenderness: There is no abdominal tenderness. There is no guarding or rebound.  Musculoskeletal: Normal range of motion.        General: No tenderness.  Skin:    General: Skin is warm.     Capillary Refill: Capillary refill takes less than 2 seconds.  Neurological:     General:  No focal deficit present.     Mental Status: He is alert and oriented to person, place, and time. Mental status is at baseline.     Cranial Nerves: No cranial nerve deficit.     Motor: No abnormal muscle tone.     Coordination: Coordination normal.     Comments: No ataxia on finger to nose bilaterally. No pronator drift. 5/5 strength throughout. CN 2-12 intact.Equal grip strength. Sensation intact.   Psychiatric:        Behavior: Behavior normal.      ED Treatments / Results  Labs (all labs ordered are listed, but only abnormal results are displayed) Labs Reviewed  BASIC METABOLIC PANEL - Abnormal; Notable for the following components:      Result Value   Potassium 3.4 (*)    Glucose, Bld 165 (*)    All other components within normal limits  CBC  TROPONIN I  TROPONIN I    EKG EKG Interpretation  Date/Time:  Wednesday June 09 2018 23:35:54 EDT Ventricular Rate:  75 PR Interval:    QRS Duration: 152 QT Interval:  440 QTC Calculation: 492 R Axis:   20 Text Interpretation:  Sinus rhythm Consider left atrial enlargement Right bundle branch block similar to previous, perhaps more diffuse ST depression Confirmed by Ezequiel Essex [phone]) on 06/09/2018 11:41:10 PM   Radiology Dg Chest 2 View  Result Date: 06/10/2018 CLINICAL DATA:  73 year old male with chest pain. EXAM: CHEST - 2 VIEW COMPARISON:  Chest CT dated 05/20/2018 FINDINGS: There  is no focal consolidation, pleural effusion, or pneumothorax. The cardiac silhouette is within normal limits. The aorta is tortuous. No acute osseous pathology. IMPRESSION: No active cardiopulmonary disease. Electronically Signed   By: Anner Crete M.D.   On: 06/10/2018 00:18   Ct Angio Chest/abd/pel For Dissection W And/or Wo Contrast  Result Date: 06/10/2018 CLINICAL DATA:  73 year old male with chest pain. Concern for acute aortic syndrome. EXAM: CT ANGIOGRAPHY CHEST, ABDOMEN AND PELVIS TECHNIQUE: Multidetector CT imaging through the chest, abdomen and pelvis was performed using the standard protocol during bolus administration of intravenous contrast. Multiplanar reconstructed images and MIPs were obtained and reviewed to evaluate the vascular anatomy. CONTRAST:  142mL OMNIPAQUE IOHEXOL 350 MG/ML SOLN COMPARISON:  Chest radiograph dated 06/09/2018 and CT dated 05/20/2018 FINDINGS: CTA CHEST FINDINGS Cardiovascular: There is no cardiomegaly or pericardial effusion. The thoracic aorta is slightly tortuous. The ascending aorta measures up to 4.5 cm in diameter (previously approximately 4.2 cm on CT of 2018). No aortic dissection. There is a diminutive right vertebral artery. The remainder of the origins of the great vessels of the aortic arch appear patent. No pulmonary artery embolus identified. Mediastinum/Nodes: There is no hilar or mediastinal adenopathy. The esophagus and the thyroid gland are grossly unremarkable. No mediastinal fluid collection. Lungs/Pleura: The lungs are clear. There is no pleural effusion or pneumothorax. The central airways are patent. Musculoskeletal: No acute osseous pathology. Review of the MIP images confirms the above findings. CTA ABDOMEN AND PELVIS FINDINGS VASCULAR Aorta: Mild atherosclerotic disease. No aneurysmal dilatation or dissection. Celiac: Patent without evidence of aneurysm, dissection, vasculitis or significant stenosis. SMA: Patent without evidence of aneurysm,  dissection, vasculitis or significant stenosis. Renals: Both renal arteries are patent without evidence of aneurysm, dissection, vasculitis, fibromuscular dysplasia or significant stenosis. Two additional accessory renal arteries noted in addition to the main renal artery on each side. IMA: Patent without evidence of aneurysm, dissection, vasculitis or significant stenosis. Inflow: Mild atherosclerotic disease. No aneurysmal dilatation  or dissection. Veins: No obvious venous abnormality within the limitations of this arterial phase study. Review of the MIP images confirms the above findings. NON-VASCULAR No intra-abdominal free air or free fluid. Hepatobiliary: No focal liver abnormality is seen. No gallstones, gallbladder wall thickening, or biliary dilatation. Pancreas: Unremarkable. No pancreatic ductal dilatation or surrounding inflammatory changes. Spleen: Normal in size without focal abnormality. Adrenals/Urinary Tract: The adrenal glands are unremarkable. There is a bilobed 4.6 x 4.8 cm predominantly exophytic cyst in the interpolar aspect of the left kidney with a thin calcification. Additional subcentimeter bilateral renal hypodense lesions are too small to characterize. There is no hydronephrosis on either side. The visualized ureters and urinary bladder appear unremarkable. Stomach/Bowel: There is extensive sigmoid diverticulosis with muscular hypertrophy. No active inflammatory changes. There are scattered colonic diverticula without active inflammation. There is no bowel obstruction or active inflammation. The appendix is normal. Lymphatic: No adenopathy. Reproductive: Mildly enlarged prostate gland measuring 5.7 cm in transverse axial diameter. The seminal vesicles are symmetric. Other: Small fat containing right inguinal hernia. Musculoskeletal: Degenerative changes of the spine. No acute osseous pathology. Review of the MIP images confirms the above findings. IMPRESSION: 1. No acute intrathoracic,  abdominal, or pelvic pathology. No aortic dissection. No CT evidence of pulmonary artery embolus. 2. Mildly dilated ascending aorta measuring 4.5 cm in diameter (previously 4.2 cm). Ascending thoracic aortic aneurysm. Recommend semi-annual imaging followup by CTA or MRA and referral to cardiothoracic surgery if not already obtained. This recommendation follows 2010 ACCF/AHA/AATS/ACR/ASA/SCA/SCAI/SIR/STS/SVM Guidelines for the Diagnosis and Management of Patients With Thoracic Aortic Disease. 2010; 121: Q119-E174. 3. Colonic diverticulosis. No bowel obstruction or active inflammation. Normal appendix. 4. Partially exophytic left renal cyst with a thin calcified septum. Follow-up with CT in 6 months recommended. Electronically Signed   By: Anner Crete M.D.   On: 06/10/2018 01:54    Procedures Procedures (including critical care time)  Medications Ordered in ED Medications  aspirin chewable tablet 324 mg (has no administration in time range)     Initial Impression / Assessment and Plan / ED Course  I have reviewed the triage vital signs and the nursing notes.  Pertinent labs & imaging results that were available during my care of the patient were reviewed by me and considered in my medical decision making (see chart for details).       Episode of central chest pain rating to back and neck now relieved after Tums.  History of thoracic aneurysm which has been stable.  EKG shows right bundle branch block with nonspecific ST depressions diffusely which appears to be stable.  ASA given. Patient remains chest pain free. Troponin negative.  CTA will be obtained to assess stability of aneurysm.  Heart score is 6.  There is some concern for angina as this pain was atypical for the patient did have radiation to his neck and back which is atypical for him.  CTA shows stable aneurysm at 4.5 cm.  He is being followed by cardiothoracic surgery.  No evidence of aortic dissection or pulmonary  embolism.  Remains chest pain-free in the ED.  Blood pressure has spontaneously improved.  Given elevated heart score and risk factors, plan observation admission for cardiac rule out.  Discussed with Dr. Darrick Meigs.  Patient in agreement.  Final Clinical Impressions(s) / ED Diagnoses   Final diagnoses:  Precordial pain    ED Discharge Orders    None       Maddux Vanscyoc, Annie Main, MD 06/10/18 [phone]

## 2018-06-10 ENCOUNTER — Emergency Department (HOSPITAL_COMMUNITY): Payer: Medicare Other

## 2018-06-10 ENCOUNTER — Encounter (HOSPITAL_COMMUNITY): Payer: Self-pay

## 2018-06-10 DIAGNOSIS — I10 Essential (primary) hypertension: Secondary | ICD-10-CM

## 2018-06-10 DIAGNOSIS — E119 Type 2 diabetes mellitus without complications: Secondary | ICD-10-CM

## 2018-06-10 DIAGNOSIS — R072 Precordial pain: Secondary | ICD-10-CM

## 2018-06-10 DIAGNOSIS — R079 Chest pain, unspecified: Secondary | ICD-10-CM | POA: Diagnosis present

## 2018-06-10 DIAGNOSIS — E785 Hyperlipidemia, unspecified: Secondary | ICD-10-CM

## 2018-06-10 DIAGNOSIS — K219 Gastro-esophageal reflux disease without esophagitis: Secondary | ICD-10-CM

## 2018-06-10 HISTORY — DX: Essential (primary) hypertension: I10

## 2018-06-10 HISTORY — DX: Hyperlipidemia, unspecified: E78.5

## 2018-06-10 LAB — TROPONIN I
Troponin I: <0.03 ng/mL (ref <0.03)
Troponin I: <0.03 ng/mL (ref <0.03)
Troponin I: <0.03 ng/mL (ref <0.03)

## 2018-06-10 LAB — BASIC METABOLIC PANEL
Anion gap: 9 (ref 5–15)
BUN: 14 mg/dL (ref 8–23)
CO2: 27 mmol/L (ref 22–32)
Calcium: 8.9 mg/dL (ref 8.9–10.3)
Chloride: 102 mmol/L (ref 98–111)
Creatinine, Ser: 1 mg/dL (ref 0.61–1.24)
GFR calc Af Amer: >60 mL/min (ref >60)
GFR calc non Af Amer: >60 mL/min (ref >60)
Glucose, Bld: 165 mg/dL — ABNORMAL HIGH (ref 70–99)
Potassium: 3.4 mmol/L — ABNORMAL LOW (ref 3.5–5.1)
Sodium: 138 mmol/L (ref 135–145)

## 2018-06-10 LAB — HEMOGLOBIN A1C
Hgb A1c MFr Bld: 7.9 % — ABNORMAL HIGH (ref 4.8–5.6)
Mean Plasma Glucose: 180.03 mg/dL

## 2018-06-10 LAB — GLUCOSE, CAPILLARY
Glucose-Capillary: 159 mg/dL — ABNORMAL HIGH (ref 70–99)
Glucose-Capillary: 145 mg/dL — ABNORMAL HIGH (ref 70–99)

## 2018-06-10 MED ORDER — METOPROLOL SUCCINATE ER 25 MG PO TB24
12.5000 mg | ORAL_TABLET | Freq: Every day | ORAL | Status: DC
  Administered 2018-06-10: 10:00:00 12.5 mg via ORAL
  Filled 2018-06-10: qty 1

## 2018-06-10 MED ORDER — IOHEXOL 350 MG/ML SOLN
100.0000 mL | Freq: Once | INTRAVENOUS | Status: AC | PRN
  Administered 2018-06-10: 01:00:00 100 mL via INTRAVENOUS

## 2018-06-10 MED ORDER — ASPIRIN EC 81 MG PO TBEC
81.0000 mg | DELAYED_RELEASE_TABLET | Freq: Every day | ORAL | Status: DC
  Filled 2018-06-10: qty 1

## 2018-06-10 MED ORDER — INSULIN ASPART 100 UNIT/ML ~~LOC~~ SOLN: 0.0000 [IU] | Freq: Three times a day (TID) | SUBCUTANEOUS | Status: DC

## 2018-06-10 MED ORDER — ATORVASTATIN CALCIUM 40 MG PO TABS
40.0000 mg | ORAL_TABLET | Freq: Every day | ORAL | Status: DC
  Administered 2018-06-10: 10:00:00 40 mg via ORAL
  Filled 2018-06-10: qty 1

## 2018-06-10 MED ORDER — LISINOPRIL 10 MG PO TABS
10.0000 mg | ORAL_TABLET | Freq: Every day | ORAL | Status: DC
  Administered 2018-06-10: 10 mg via ORAL
  Filled 2018-06-10: qty 1

## 2018-06-10 MED ORDER — PANTOPRAZOLE SODIUM 40 MG PO TBEC: 40.0000 mg | DELAYED_RELEASE_TABLET | Freq: Every day | ORAL | 1 refills | Status: AC

## 2018-06-10 MED ORDER — ENOXAPARIN SODIUM 40 MG/0.4ML ~~LOC~~ SOLN
40.0000 mg | SUBCUTANEOUS | Status: DC
  Administered 2018-06-10: 10:00:00 40 mg via SUBCUTANEOUS
  Filled 2018-06-10: qty 0.4

## 2018-06-10 MED ORDER — INFLUENZA VAC SPLIT HIGH-DOSE 0.5 ML IM SUSY
0.5000 mL | PREFILLED_SYRINGE | INTRAMUSCULAR | Status: AC
  Administered 2018-06-10: 12:00:00 0.5 mL via INTRAMUSCULAR
  Filled 2018-06-10: qty 0.5

## 2018-06-10 MED ORDER — HYDROCHLOROTHIAZIDE 25 MG PO TABS
12.5000 mg | ORAL_TABLET | Freq: Every day | ORAL | Status: DC
  Administered 2018-06-10: 12.5 mg via ORAL
  Filled 2018-06-10: qty 1

## 2018-06-10 MED ORDER — ONDANSETRON HCL 4 MG/2ML IJ SOLN: 4.0000 mg | Freq: Four times a day (QID) | INTRAMUSCULAR | Status: DC | PRN

## 2018-06-10 MED ORDER — METFORMIN HCL 500 MG PO TABS: 500.0000 mg | ORAL_TABLET | Freq: Every day | ORAL | Status: DC

## 2018-06-10 MED ORDER — ACETAMINOPHEN 325 MG PO TABS: 650.0000 mg | ORAL_TABLET | ORAL | Status: DC | PRN

## 2018-06-10 MED ORDER — POTASSIUM CHLORIDE CRYS ER 20 MEQ PO TBCR
20.0000 meq | EXTENDED_RELEASE_TABLET | Freq: Once | ORAL | Status: AC
  Administered 2018-06-10: 10:00:00 20 meq via ORAL
  Filled 2018-06-10: qty 1

## 2018-06-10 NOTE — H&P (Signed)
TRH H&P    Patient Demographics:    Brian Gray, is a 73 y.o. male  MRN: [id number]  DOB - 1945-12-08  Admit Date - 06/09/2018  Referring MD/NP/PA: Derrek Monaco  Outpatient Primary MD for the patient is [name], MD  Patient coming from: Home  Chief complaint-chest pain   HPI:    Brian Gray  is a 73 y.o. male, with history of diabetes mellitus type 2, hypertension, hyperlipidemia, Thoracic aortic aneurysm came to hospital with chest pain.  Patient stated pain was located in substernal region felt like pressure.  Pain did not radiate.  No shortness of breath, nausea vomiting or diarrhea.  Patient took Tums at home and felt better after about 15 minutes.  Patient blood pressure was elevated and he was told by nurse line to go to the ED given history of thoracic aortic aneurysm.  Patient had a CT of the chest on March 12, which showed no significant change in the enlargement of the tubular ascending thoracic aorta measuring 4.3 x 4.2 cm. Today in the ED he had repeat CTA chest/Abdomen/pelvis for dissection which showed no acute intrathoracic, abdominal or pelvic pathology. Cardiac enzymes x2 are negative in the ED. Denies previous history of stroke or seizures. Patient is chest pain-free at this time.    Review of systems:    In addition to the HPI above,    All other systems reviewed and are negative.    Past History of the following :    Past Medical History:  Diagnosis Date  . Hyperlipidemia   . Hypertension       Past Surgical History:  Procedure Laterality Date  . CARDIAC CATHETERIZATION    . CARDIAC CATHETERIZATION  02/21/2011   Normal Coronary Arteries  . COLONOSCOPY  05/15/2011   Procedure: COLONOSCOPY;  Surgeon: [name], MD;  Location: [facility];  Service: Endoscopy;  Laterality: N/A;  930  . DOPPLER ECHOCARDIOGRAPHY  03/20/2008   EF>55%  . LEFT HEART CATHETERIZATION WITH  CORONARY ANGIOGRAM N/A 02/21/2011   Procedure: LEFT HEART CATHETERIZATION WITH CORONARY ANGIOGRAM;  Surgeon: [name], MD;  Location: [facility];  Service: Cardiovascular;  Laterality: N/A;  . NM MYOVIEW LTD  02/04/2011   EF 58%      Social History:      Social History   Tobacco Use  . Smoking status: Never Smoker  . Smokeless tobacco: Never Used  Substance Use Topics  . Alcohol use: No       Family History :     Family History  Problem Relation Age of Onset  . Colon cancer Neg Hx    Patient's 2 brothers have history of CAD    Home Medications:   Prior to Admission medications   Medication Sig Start Date End Date Taking? Authorizing Provider  aspirin EC 81 MG tablet Take 81 mg by mouth daily.      [provider]  atorvastatin (LIPITOR) 40 MG tablet Take 1 tablet by mouth daily. 03/17/14   [provider]  CINNAMON PO  Take 1,000 mg by mouth daily.     [provider]  hydrochlorothiazide (HYDRODIURIL) 12.5 MG tablet Take 12.5 mg by mouth daily.      [provider]  lisinopril (PRINIVIL,ZESTRIL) 10 MG tablet Take 10 mg by mouth daily.    [provider]  metFORMIN (GLUCOPHAGE) 500 MG tablet Take by mouth daily with breakfast.    [provider]  metoprolol succinate (TOPROL-XL) 25 MG 24 hr tablet TAKE ONE-HALF TABLET BY  MOUTH DAILY 01/05/18   Croitoru, Mihai, MD  naproxen sodium (ANAPROX) 220 MG tablet Take 220 mg by mouth every 8 (eight) hours as needed. For pain    [provider]  potassium chloride (MICRO-K) 10 MEQ CR capsule Take 10 mEq by mouth daily as needed.    [provider]  vitamin B-12 (CYANOCOBALAMIN) 1000 MCG tablet Take 1,000 mcg by mouth daily.      [provider]     Allergies:     Allergies  Allergen Reactions  . Quinolones      Physical Exam:   Vitals  Blood pressure (!) 151/88, pulse 70, temperature 97.8 F (36.6 C), temperature source Oral, resp.  rate 15, height 5\' 11"  (1.803 m), weight 84.8 kg, SpO2 99 %.  1.  General: Appears in no acute distress  2. Psychiatric: Alert, oriented x3, intact insight and judgment  3. Neurologic: Cranial nerves II through grossly intact, motor strength 5/5 in all extremities  4. HEENMT:  Atraumatic normocephalic, extraocular muscles intact.  5. Respiratory : Clear to auscultation bilaterally, no wheezing or crackles.  6. Cardiovascular : S1-S2, regular, no murmur auscultated.  7. Gastrointestinal:  Abdomen is soft, nontender, no organomegaly      Data Review:    CBC Recent Labs  Lab 06/09/18 2342  WBC 7.1  HGB 13.6  HCT 40.9  PLT 211  MCV 91.7  MCH 30.5  MCHC 33.3  RDW 12.4   ------------------------------------------------------------------------------------------------------------------  Results for orders placed or performed during the hospital encounter of 06/09/18 (from the past 48 hour(s))  Basic metabolic panel     Status: Abnormal   Collection Time: 06/09/18 11:42 PM  Result Value Ref Range   Sodium 138 135 - 145 mmol/L   Potassium 3.4 (L) 3.5 - 5.1 mmol/L   Chloride 102 98 - 111 mmol/L   CO2 27 22 - 32 mmol/L   Glucose, Bld 165 (H) 70 - 99 mg/dL   BUN 14 8 - 23 mg/dL   Creatinine, Ser 1.00 0.61 - 1.24 mg/dL   Calcium 8.9 8.9 - 10.3 mg/dL   GFR calc non Af Amer >60 >60 mL/min   GFR calc Af Amer >60 >60 mL/min   Anion gap 9 5 - 15    Comment: Performed at [facility], [address] [place], [place] [address]  CBC     Status: None   Collection Time: 06/09/18 11:42 PM  Result Value Ref Range   WBC 7.1 4.0 - 10.5 K/uL   RBC 4.46 4.22 - 5.81 MIL/uL   Hemoglobin 13.6 13.0 - 17.0 g/dL   HCT 40.9 39.0 - 52.0 %   MCV 91.7 80.0 - 100.0 fL   MCH 30.5 26.0 - 34.0 pg   MCHC 33.3 30.0 - 36.0 g/dL   RDW 12.4 11.5 - 15.5 %   Platelets 211 150 - 400 K/uL   nRBC 0.0 0.0 - 0.2 %    Comment: Performed at [facility], [address], [place], [place] [address]  Troponin I - ONCE - STAT     Status: None   Collection Time: 06/09/18 11:42 PM  Result Value Ref Range   Troponin I <0.03 <0.03 ng/mL    Comment: Performed at [facility], [address], [place], [place] [address]  Troponin I - ONCE - STAT     Status: None   Collection Time: 06/10/18  2:41 AM  Result Value Ref Range   Troponin I <0.03 <0.03 ng/mL    Comment: Performed at [facility], [address], [place], [place] [address]    Chemistries  Recent Labs  Lab 06/09/18 2342  NA 138  K 3.4*  CL 102  CO2 27  GLUCOSE 165*  BUN 14  CREATININE 1.00  CALCIUM 8.9   ------------------------------------------------------------------------------------------------------------------  ------------------------------------------------------------------------------------------------------------------ GFR: Estimated Creatinine Clearance: 71.1 mL/min (by C-G formula based on SCr of 1 mg/dL). Liver Function Tests: No results for input(s): AST, ALT, ALKPHOS, BILITOT, PROT, ALBUMIN in the last 168 hours. No results for input(s): LIPASE, AMYLASE in the last 168 hours. No results for input(s): AMMONIA in the last 168 hours. Coagulation Profile: No results for input(s): INR, PROTIME in the last 168 hours. Cardiac Enzymes: Recent Labs  Lab 06/09/18 2342 06/10/18 0241  TROPONINI <0.03 <0.03   BNP (last 3 results) No results for input(s): PROBNP in the last 8760 hours. HbA1C: No results for input(s): HGBA1C in the last 72 hours. CBG: No results for input(s): GLUCAP in the last 168 hours. Lipid Profile: No results for input(s): CHOL, HDL, LDLCALC, TRIG, CHOLHDL, LDLDIRECT in the last 72 hours. Thyroid Function Tests: No results for input(s): TSH, T4TOTAL, FREET4, T3FREE, THYROIDAB in the last 72 hours. Anemia Panel: No results for input(s): VITAMINB12, FOLATE, FERRITIN, TIBC, IRON, RETICCTPCT in the last 72  hours.  --------------------------------------------------------------------------------------------------------------- Urine analysis: No results found for: COLORURINE, APPEARANCEUR, LABSPEC, PHURINE, GLUCOSEU, HGBUR, BILIRUBINUR, KETONESUR, PROTEINUR, UROBILINOGEN, NITRITE, LEUKOCYTESUR    Imaging Results:    Dg Chest 2 View  Result Date: 06/10/2018 CLINICAL DATA:  73 year old male with chest pain. EXAM: CHEST - 2 VIEW COMPARISON:  Chest CT dated 05/20/2018 FINDINGS: There is no focal consolidation, pleural effusion, or pneumothorax. The cardiac silhouette is within normal limits. The aorta is tortuous. No acute osseous pathology. IMPRESSION: No active cardiopulmonary disease. Electronically Signed   By: Anner Crete M.D.   On: 06/10/2018 00:18   Ct Angio Chest/abd/pel For Dissection W And/or Wo Contrast  Result Date: 06/10/2018 CLINICAL DATA:  73 year old male with chest pain. Concern for acute aortic syndrome. EXAM: CT ANGIOGRAPHY CHEST, ABDOMEN AND PELVIS TECHNIQUE: Multidetector CT imaging through the chest, abdomen and pelvis was performed using the standard protocol during bolus administration of intravenous contrast. Multiplanar reconstructed images and MIPs were obtained and reviewed to evaluate the vascular anatomy. CONTRAST:  131mL OMNIPAQUE IOHEXOL 350 MG/ML SOLN COMPARISON:  Chest radiograph dated 06/09/2018 and CT dated 05/20/2018 FINDINGS: CTA CHEST FINDINGS Cardiovascular: There is no cardiomegaly or pericardial effusion. The thoracic aorta is slightly tortuous. The ascending aorta measures up to 4.5 cm in diameter (previously approximately 4.2 cm on CT of 2018). No aortic dissection. There is a diminutive right vertebral artery. The remainder of the origins of the great vessels of the aortic arch appear patent. No pulmonary artery embolus identified. Mediastinum/Nodes: There is no hilar or mediastinal adenopathy. The esophagus and the thyroid gland are grossly unremarkable. No  mediastinal fluid collection. Lungs/Pleura: The lungs are clear. There is no pleural effusion or pneumothorax. The central airways are patent. Musculoskeletal: No  acute osseous pathology. Review of the MIP images confirms the above findings. CTA ABDOMEN AND PELVIS FINDINGS VASCULAR Aorta: Mild atherosclerotic disease. No aneurysmal dilatation or dissection. Celiac: Patent without evidence of aneurysm, dissection, vasculitis or significant stenosis. SMA: Patent without evidence of aneurysm, dissection, vasculitis or significant stenosis. Renals: Both renal arteries are patent without evidence of aneurysm, dissection, vasculitis, fibromuscular dysplasia or significant stenosis. Two additional accessory renal arteries noted in addition to the main renal artery on each side. IMA: Patent without evidence of aneurysm, dissection, vasculitis or significant stenosis. Inflow: Mild atherosclerotic disease. No aneurysmal dilatation or dissection. Veins: No obvious venous abnormality within the limitations of this arterial phase study. Review of the MIP images confirms the above findings. NON-VASCULAR No intra-abdominal free air or free fluid. Hepatobiliary: No focal liver abnormality is seen. No gallstones, gallbladder wall thickening, or biliary dilatation. Pancreas: Unremarkable. No pancreatic ductal dilatation or surrounding inflammatory changes. Spleen: Normal in size without focal abnormality. Adrenals/Urinary Tract: The adrenal glands are unremarkable. There is a bilobed 4.6 x 4.8 cm predominantly exophytic cyst in the interpolar aspect of the left kidney with a thin calcification. Additional subcentimeter bilateral renal hypodense lesions are too small to characterize. There is no hydronephrosis on either side. The visualized ureters and urinary bladder appear unremarkable. Stomach/Bowel: There is extensive sigmoid diverticulosis with muscular hypertrophy. No active inflammatory changes. There are scattered colonic  diverticula without active inflammation. There is no bowel obstruction or active inflammation. The appendix is normal. Lymphatic: No adenopathy. Reproductive: Mildly enlarged prostate gland measuring 5.7 cm in transverse axial diameter. The seminal vesicles are symmetric. Other: Small fat containing right inguinal hernia. Musculoskeletal: Degenerative changes of the spine. No acute osseous pathology. Review of the MIP images confirms the above findings. IMPRESSION: 1. No acute intrathoracic, abdominal, or pelvic pathology. No aortic dissection. No CT evidence of pulmonary artery embolus. 2. Mildly dilated ascending aorta measuring 4.5 cm in diameter (previously 4.2 cm). Ascending thoracic aortic aneurysm. Recommend semi-annual imaging followup by CTA or MRA and referral to cardiothoracic surgery if not already obtained. This recommendation follows 2010 ACCF/AHA/AATS/ACR/ASA/SCA/SCAI/SIR/STS/SVM Guidelines for the Diagnosis and Management of Patients With Thoracic Aortic Disease. 2010; 121: I297-L892. 3. Colonic diverticulosis. No bowel obstruction or active inflammation. Normal appendix. 4. Partially exophytic left renal cyst with a thin calcified septum. Follow-up with CT in 6 months recommended. Electronically Signed   By: Anner Crete M.D.   On: 06/10/2018 01:54    My personal review of EKG: Rhythm NSR, right bundle branch block.   Assessment & Plan:    Active Problems:   Chest pain   1. Chest pain-resolved at this time, placed under observation, rule out ACS.  Will obtain serial troponin every 6 hours x3.  Heart score of 6.  2. Diabetes mellitus type 2-hold metformin, will start sliding scale insulin with NovoLog.  3. Hypertension-continue hydrochlorothiazide, lisinopril, Toprol XL  4. Hyperlipidemia-continue atorvastatin  5. History of thoracic aortic aneurysm-stable, followed by CT surgery and cardiology as outpatient.Will need every 6 months follow-up with CTA or MRA.    DVT  Prophylaxis-   Lovenox   AM Labs Ordered, also please review Full Orders  Family Communication: Admission, patients condition and plan of care including tests being ordered have been discussed with the patient  who indicate understanding and agree with the plan and Code Status.  Code Status: DNR  Admission status: Observation Based on patients clinical presentation and evaluation of above clinical data, I have made determination that patient meets Inpatient criteria  at this time.  Time spent in minutes : 60 minutes   Oswald Hillock M.D on 06/10/2018 at 4:12 AM

## 2018-06-10 NOTE — ED Notes (Signed)
Pt ambulatory to the bathroom 

## 2018-06-10 NOTE — Discharge Summary (Signed)
Physician Discharge Summary  Brian Gray HQI:[id number] DOB: 01/27/1946 DOA: 06/09/2018  PCP: [name], MD  Admit date: 06/09/2018 Discharge date: 06/10/2018  Time spent: 35 minutes  Recommendations for Outpatient Follow-up:  1. Repeat basic metabolic panel to follow electrolytes and renal function 2. Reassess CBGs and A1c with further adjustment to hypoglycemic regimen as needed. 3. Follow symptoms resolution after initiation of PPI. 4. Outpatient follow-up with cardiology service as instructed.   Discharge Diagnoses:  Active Problems:   Hyperlipidemia   Chest pain   Gastroesophageal reflux disease   Type 2 diabetes mellitus without complication, without long-term current use of insulin (HCC) Essential hypertension Thoracic aorta aneurysm  Discharge Condition: Stable and improved.  Patient discharged home with instruction to follow-up with PCP in 10 days.  Diet recommendation: Heart healthy and modify carbohydrates.  Filed Weights   06/09/18 2335 06/10/18 0833  Weight: 84.8 kg 88.8 kg    History of present illness:  As per H&P written by Dr. Darrick Meigs on 06/10/2018 73 y.o. male, with history of diabetes mellitus type 2, hypertension, hyperlipidemia, Thoracic aortic aneurysm came to hospital with chest pain.  Patient stated pain was located in substernal region felt like pressure.  Pain did not radiate.  No shortness of breath, nausea vomiting or diarrhea.  Patient took Tums at home and felt better after about 15 minutes.  Patient blood pressure was elevated and he was told by nurse line to go to the ED given history of thoracic aortic aneurysm.  Patient had a CT of the chest on March 12, which showed no significant change in the enlargement of the tubular ascending thoracic aorta measuring 4.3 x 4.2 cm. Today in the ED he had repeat CTA chest/Abdomen/pelvis for dissection which showed no acute intrathoracic, abdominal or pelvic pathology. Cardiac enzymes x2 are negative in the  ED. Denies previous history of stroke or seizures. Patient is chest pain-free at this time.  Hospital Course:  1-chest pain: Atypical and noncardiac. -No acute ischemic changes appreciated on EKG or telemetry -Troponin negative x3 -Symptoms resolve after the use of H2 blocker at home -Patient chronically using NSAIDs to assist with pain. -Outpatient follow-up from a cardiac risk stratification with cardiologist service. -No further acute ischemic evaluation needed at this time; continue aspirin, statins, beta-blocker and ACE inhibitor. -Patient discharged home on Protonix.  2-essential hypertension -Continue current antihypertensive regimen -Patient advised to follow heart healthy diet and to give himself well-hydrated.  3-type 2 diabetes mellitus -Continue modified carbohydrate diet and the use of metformin. -Outpatient follow-up of CBGs/A1c.  4-hyperlipidemia -Continue statins  5-GERD -Mentioned above patient has been discharged home on Protonix.  6-hypokalemia -Appears to be associated with the use of diuretics -Continue chronic daily supplementation.  7-history of thoracic aortic aneurysm -Stable -Continue outpatient follow-up with CTA or MRA in 68-month. -Patient actively follow by cardiothoracic surgery and cardiology service.  Procedures:  See below for x-ray reports.  Consultations:  None  Discharge Exam: Vitals:   06/10/18 0800 06/10/18 0833  BP: (!) 161/89 (!) 152/86  Pulse: 80 72  Resp: 14 16  Temp:  98.2 F (36.8 C)  SpO2: 97% 97%    General: Afebrile, no shortness of breath, no nausea, no vomiting.  Patient denies any chest pain or any other acute complaints currently. Cardiovascular: S1 and S2, no rubs, no gallops, no JVD. Respiratory: Clear to auscultation bilaterally, normal respiratory effort. Abdomen: Soft, nontender, nondistended, positive bowel sounds. Extremities: No cyanosis, no clubbing.  Discharge Instructions   Discharge  Instructions    Diet - low sodium heart healthy   Complete by:  As directed    Discharge instructions   Complete by:  As directed    Take medications as prescribed Minimize the use of NSAIDs Maintain adequate hydration Follow heart healthy diet Arrange follow-up with PCP in 10 days.     Allergies as of 06/10/2018      Reactions   Quinolones       Medication List    TAKE these medications   aspirin EC 81 MG tablet Take 81 mg by mouth daily.   atorvastatin 40 MG tablet Commonly known as:  LIPITOR Take 1 tablet by mouth daily.   CINNAMON PO Take 1,000 mg by mouth daily.   hydrochlorothiazide 12.5 MG tablet Commonly known as:  HYDRODIURIL Take 12.5 mg by mouth daily.   lisinopril 10 MG tablet Commonly known as:  PRINIVIL,ZESTRIL Take 10 mg by mouth daily.   metFORMIN 500 MG tablet Commonly known as:  GLUCOPHAGE Take 1 tablet (500 mg total) by mouth daily with breakfast. Start taking on:  June 12, 2018 What changed:    how much to take  These instructions start on June 12, 2018. If you are unsure what to do until then, ask your doctor or other care provider.   metoprolol succinate 25 MG 24 hr tablet Commonly known as:  TOPROL-XL TAKE ONE-HALF TABLET BY  MOUTH DAILY What changed:    how to take this  when to take this   naproxen sodium 220 MG tablet Commonly known as:  ALEVE Take 220 mg by mouth every 8 (eight) hours as needed. For pain   pantoprazole 40 MG tablet Commonly known as:  Protonix Take 1 tablet (40 mg total) by mouth daily.   potassium chloride 10 MEQ CR capsule Commonly known as:  MICRO-K Take 10 mEq by mouth daily as needed.   vitamin B-12 1000 MCG tablet Commonly known as:  CYANOCOBALAMIN Take 1,000 mcg by mouth daily.      Allergies  Allergen Reactions  . Quinolones    Follow-up Information    [name], MD. Schedule an appointment as soon as possible for a visit in 10 day(s).   Specialty:  Family Medicine Contact  information: [place] [address] [phone]        [name], MD .   Specialty:  Cardiology Contact information: [address] [place] [address] [phone]           The results of significant diagnostics from this hospitalization (including imaging, microbiology, ancillary and laboratory) are listed below for reference.    Significant Diagnostic Studies: Dg Chest 2 View  Result Date: 06/10/2018 CLINICAL DATA:  73 year old male with chest pain. EXAM: CHEST - 2 VIEW COMPARISON:  Chest CT dated 05/20/2018 FINDINGS: There is no focal consolidation, pleural effusion, or pneumothorax. The cardiac silhouette is within normal limits. The aorta is tortuous. No acute osseous pathology. IMPRESSION: No active cardiopulmonary disease. Electronically Signed   By: Anner Crete M.D.   On: 06/10/2018 00:18   Ct Angio Chest Aorta W/cm &/or Wo/cm  Result Date: 05/20/2018 CLINICAL DATA:  Follow-up TAA EXAM: CT ANGIOGRAPHY CHEST WITH CONTRAST TECHNIQUE: Multidetector CT imaging of the chest was performed using the standard protocol during bolus administration of intravenous contrast. Multiplanar CT image reconstructions and MIPs were obtained to evaluate the vascular anatomy. CONTRAST:  17mL ISOVUE-370 IOPAMIDOL (ISOVUE-370) INJECTION 76% COMPARISON:  11/13/2016 FINDINGS: Cardiovascular: No significant change in  enlargement of the tubular ascending thoracic aorta measuring 4.3 x 4.2 cm. Normal caliber of the descending thoracic aorta. Minimal scattered atherosclerosis. Left coronary artery calcifications. Mediastinum/Nodes: No enlarged mediastinal, hilar, or axillary lymph nodes. Thyroid gland, trachea, and esophagus demonstrate no significant findings. Lungs/Pleura: Lungs are clear. No pleural effusion or pneumothorax. Upper Abdomen: No acute abnormality. Musculoskeletal: No chest wall abnormality. No acute or significant osseous findings. Review of the  MIP images confirms the above findings. IMPRESSION: No significant change in enlargement of the tubular ascending thoracic aorta measuring 4.3 x 4.2 cm. Electronically Signed   By: Eddie Candle M.D.   On: 05/20/2018 10:38   Ct Angio Chest/abd/pel For Dissection W And/or Wo Contrast  Result Date: 06/10/2018 CLINICAL DATA:  73 year old male with chest pain. Concern for acute aortic syndrome. EXAM: CT ANGIOGRAPHY CHEST, ABDOMEN AND PELVIS TECHNIQUE: Multidetector CT imaging through the chest, abdomen and pelvis was performed using the standard protocol during bolus administration of intravenous contrast. Multiplanar reconstructed images and MIPs were obtained and reviewed to evaluate the vascular anatomy. CONTRAST:  170mL OMNIPAQUE IOHEXOL 350 MG/ML SOLN COMPARISON:  Chest radiograph dated 06/09/2018 and CT dated 05/20/2018 FINDINGS: CTA CHEST FINDINGS Cardiovascular: There is no cardiomegaly or pericardial effusion. The thoracic aorta is slightly tortuous. The ascending aorta measures up to 4.5 cm in diameter (previously approximately 4.2 cm on CT of 2018). No aortic dissection. There is a diminutive right vertebral artery. The remainder of the origins of the great vessels of the aortic arch appear patent. No pulmonary artery embolus identified. Mediastinum/Nodes: There is no hilar or mediastinal adenopathy. The esophagus and the thyroid gland are grossly unremarkable. No mediastinal fluid collection. Lungs/Pleura: The lungs are clear. There is no pleural effusion or pneumothorax. The central airways are patent. Musculoskeletal: No acute osseous pathology. Review of the MIP images confirms the above findings. CTA ABDOMEN AND PELVIS FINDINGS VASCULAR Aorta: Mild atherosclerotic disease. No aneurysmal dilatation or dissection. Celiac: Patent without evidence of aneurysm, dissection, vasculitis or significant stenosis. SMA: Patent without evidence of aneurysm, dissection, vasculitis or significant stenosis. Renals:  Both renal arteries are patent without evidence of aneurysm, dissection, vasculitis, fibromuscular dysplasia or significant stenosis. Two additional accessory renal arteries noted in addition to the main renal artery on each side. IMA: Patent without evidence of aneurysm, dissection, vasculitis or significant stenosis. Inflow: Mild atherosclerotic disease. No aneurysmal dilatation or dissection. Veins: No obvious venous abnormality within the limitations of this arterial phase study. Review of the MIP images confirms the above findings. NON-VASCULAR No intra-abdominal free air or free fluid. Hepatobiliary: No focal liver abnormality is seen. No gallstones, gallbladder wall thickening, or biliary dilatation. Pancreas: Unremarkable. No pancreatic ductal dilatation or surrounding inflammatory changes. Spleen: Normal in size without focal abnormality. Adrenals/Urinary Tract: The adrenal glands are unremarkable. There is a bilobed 4.6 x 4.8 cm predominantly exophytic cyst in the interpolar aspect of the left kidney with a thin calcification. Additional subcentimeter bilateral renal hypodense lesions are too small to characterize. There is no hydronephrosis on either side. The visualized ureters and urinary bladder appear unremarkable. Stomach/Bowel: There is extensive sigmoid diverticulosis with muscular hypertrophy. No active inflammatory changes. There are scattered colonic diverticula without active inflammation. There is no bowel obstruction or active inflammation. The appendix is normal. Lymphatic: No adenopathy. Reproductive: Mildly enlarged prostate gland measuring 5.7 cm in transverse axial diameter. The seminal vesicles are symmetric. Other: Small fat containing right inguinal hernia. Musculoskeletal: Degenerative changes of the spine. No acute osseous pathology. Review of the MIP  images confirms the above findings. IMPRESSION: 1. No acute intrathoracic, abdominal, or pelvic pathology. No aortic dissection. No  CT evidence of pulmonary artery embolus. 2. Mildly dilated ascending aorta measuring 4.5 cm in diameter (previously 4.2 cm). Ascending thoracic aortic aneurysm. Recommend semi-annual imaging followup by CTA or MRA and referral to cardiothoracic surgery if not already obtained. This recommendation follows 2010 ACCF/AHA/AATS/ACR/ASA/SCA/SCAI/SIR/STS/SVM Guidelines for the Diagnosis and Management of Patients With Thoracic Aortic Disease. 2010; 121: I312-O118. 3. Colonic diverticulosis. No bowel obstruction or active inflammation. Normal appendix. 4. Partially exophytic left renal cyst with a thin calcified septum. Follow-up with CT in 6 months recommended. Electronically Signed   By: Anner Crete M.D.   On: 06/10/2018 01:54   Labs: Basic Metabolic Panel: Recent Labs  Lab 06/09/18 2342  NA 138  K 3.4*  CL 102  CO2 27  GLUCOSE 165*  BUN 14  CREATININE 1.00  CALCIUM 8.9   CBC: Recent Labs  Lab 06/09/18 2342  WBC 7.1  HGB 13.6  HCT 40.9  MCV 91.7  PLT 211   Cardiac Enzymes: Recent Labs  Lab 06/09/18 2342 06/10/18 0241 06/10/18 0931  TROPONINI <0.03 <0.03 <0.03   CBG: Recent Labs  Lab 06/10/18 0843 06/10/18 1132  GLUCAP 159* 145*    Signed:  Barton Dubois MD.  [facility] 06/10/2018, 12:00 PM

## 2018-06-10 NOTE — ED Notes (Signed)
ED TO INPATIENT HANDOFF REPORT  ED Nurse Name and Phone #:    Name/Age/Gender Brian Gray 73 y.o. male Room/Bed: APA01/APA01  Code Status   Code Status: Not on file  Home/SNF/Other HOme Alert, oriented and ambulatory Is this baseline? YES  Triage Complete: Triage complete  Chief Complaint chest pains  Triage Note Patient states chest pain that started 2 hours ago . Patient states he took Tums at home thinking it was indigestion. Patient states pain is in center of chest. Patient states pain is radiating under his rib cage. Patient does have a history of an aneurysm.    Allergies Allergies  Allergen Reactions  . Quinolones     Level of Care/Admitting Diagnosis ED Disposition    ED Disposition Condition [facility]: [facility] [[id number]]  Level of Care: Telemetry [5]  Diagnosis: Chest pain [967591]  Admitting Physician: Oswald Hillock [[id number]]  Attending Physician: Oswald Hillock [[id number]]  PT Class (Do Not Modify): Observation [104]  PT Acc Code (Do Not Modify): Observation [10022]       B Medical/Surgery History Past Medical History:  Diagnosis Date  . Hyperlipidemia   . Hypertension    Past Surgical History:  Procedure Laterality Date  . CARDIAC CATHETERIZATION    . CARDIAC CATHETERIZATION  02/21/2011   Normal Coronary Arteries  . COLONOSCOPY  05/15/2011   Procedure: COLONOSCOPY;  Surgeon: [name], MD;  Location: [facility];  Service: Endoscopy;  Laterality: N/A;  930  . DOPPLER ECHOCARDIOGRAPHY  03/20/2008   EF>55%  . LEFT HEART CATHETERIZATION WITH CORONARY ANGIOGRAM N/A 02/21/2011   Procedure: LEFT HEART CATHETERIZATION WITH CORONARY ANGIOGRAM;  Surgeon: [name], MD;  Location: [facility];  Service: Cardiovascular;  Laterality: N/A;  . NM MYOVIEW LTD  02/04/2011   EF 58%     A IV Location/Drains/Wounds Patient Lines/Drains/Airways Status   Active Line/Drains/Airways    Name:   Placement date:   Placement  time:   Site:   Days:   Peripheral IV 06/09/18 Right;Upper Arm   06/09/18    2341    Arm   1          Intake/Output Last 24 hours No intake or output data in the 24 hours ending 06/10/18 0703  Labs/Imaging Results for orders placed or performed during the hospital encounter of 06/09/18 (from the past 48 hour(s))  Basic metabolic panel     Status: Abnormal   Collection Time: 06/09/18 11:42 PM  Result Value Ref Range   Sodium 138 135 - 145 mmol/L   Potassium 3.4 (L) 3.5 - 5.1 mmol/L   Chloride 102 98 - 111 mmol/L   CO2 27 22 - 32 mmol/L   Glucose, Bld 165 (H) 70 - 99 mg/dL   BUN 14 8 - 23 mg/dL   Creatinine, Ser 1.00 0.61 - 1.24 mg/dL   Calcium 8.9 8.9 - 10.3 mg/dL   GFR calc non Af Amer >60 >60 mL/min   GFR calc Af Amer >60 >60 mL/min   Anion gap 9 5 - 15    Comment: Performed at [facility], [address], [place], [place] [address]  CBC     Status: None   Collection Time: 06/09/18 11:42 PM  Result Value Ref Range   WBC 7.1 4.0 - 10.5 K/uL   RBC 4.46 4.22 - 5.81 MIL/uL   Hemoglobin 13.6 13.0 - 17.0 g/dL   HCT 40.9 39.0 - 52.0 %  MCV 91.7 80.0 - 100.0 fL   MCH 30.5 26.0 - 34.0 pg   MCHC 33.3 30.0 - 36.0 g/dL   RDW 12.4 11.5 - 15.5 %   Platelets 211 150 - 400 K/uL   nRBC 0.0 0.0 - 0.2 %    Comment: Performed at [facility], [address], [place], [place] [address]  Troponin I - ONCE - STAT     Status: None   Collection Time: 06/09/18 11:42 PM  Result Value Ref Range   Troponin I <0.03 <0.03 ng/mL    Comment: Performed at [facility], [address], [place], [place] [address]  Troponin I - ONCE - STAT     Status: None   Collection Time: 06/10/18  2:41 AM  Result Value Ref Range   Troponin I <0.03 <0.03 ng/mL    Comment: Performed at [facility], [address], [place], [place] [address]   Dg Chest 2 View  Result Date: 06/10/2018 CLINICAL DATA:  73 year old male with chest pain. EXAM: CHEST - 2 VIEW COMPARISON:  Chest CT dated 05/20/2018 FINDINGS: There is no  focal consolidation, pleural effusion, or pneumothorax. The cardiac silhouette is within normal limits. The aorta is tortuous. No acute osseous pathology. IMPRESSION: No active cardiopulmonary disease. Electronically Signed   By: Anner Crete M.D.   On: 06/10/2018 00:18   Ct Angio Chest/abd/pel For Dissection W And/or Wo Contrast  Result Date: 06/10/2018 CLINICAL DATA:  73 year old male with chest pain. Concern for acute aortic syndrome. EXAM: CT ANGIOGRAPHY CHEST, ABDOMEN AND PELVIS TECHNIQUE: Multidetector CT imaging through the chest, abdomen and pelvis was performed using the standard protocol during bolus administration of intravenous contrast. Multiplanar reconstructed images and MIPs were obtained and reviewed to evaluate the vascular anatomy. CONTRAST:  132mL OMNIPAQUE IOHEXOL 350 MG/ML SOLN COMPARISON:  Chest radiograph dated 06/09/2018 and CT dated 05/20/2018 FINDINGS: CTA CHEST FINDINGS Cardiovascular: There is no cardiomegaly or pericardial effusion. The thoracic aorta is slightly tortuous. The ascending aorta measures up to 4.5 cm in diameter (previously approximately 4.2 cm on CT of 2018). No aortic dissection. There is a diminutive right vertebral artery. The remainder of the origins of the great vessels of the aortic arch appear patent. No pulmonary artery embolus identified. Mediastinum/Nodes: There is no hilar or mediastinal adenopathy. The esophagus and the thyroid gland are grossly unremarkable. No mediastinal fluid collection. Lungs/Pleura: The lungs are clear. There is no pleural effusion or pneumothorax. The central airways are patent. Musculoskeletal: No acute osseous pathology. Review of the MIP images confirms the above findings. CTA ABDOMEN AND PELVIS FINDINGS VASCULAR Aorta: Mild atherosclerotic disease. No aneurysmal dilatation or dissection. Celiac: Patent without evidence of aneurysm, dissection, vasculitis or significant stenosis. SMA: Patent without evidence of aneurysm,  dissection, vasculitis or significant stenosis. Renals: Both renal arteries are patent without evidence of aneurysm, dissection, vasculitis, fibromuscular dysplasia or significant stenosis. Two additional accessory renal arteries noted in addition to the main renal artery on each side. IMA: Patent without evidence of aneurysm, dissection, vasculitis or significant stenosis. Inflow: Mild atherosclerotic disease. No aneurysmal dilatation or dissection. Veins: No obvious venous abnormality within the limitations of this arterial phase study. Review of the MIP images confirms the above findings. NON-VASCULAR No intra-abdominal free air or free fluid. Hepatobiliary: No focal liver abnormality is seen. No gallstones, gallbladder wall thickening, or biliary dilatation. Pancreas: Unremarkable. No pancreatic ductal dilatation or surrounding inflammatory changes. Spleen: Normal in size without focal abnormality. Adrenals/Urinary Tract: The adrenal glands are unremarkable. There is a  bilobed 4.6 x 4.8 cm predominantly exophytic cyst in the interpolar aspect of the left kidney with a thin calcification. Additional subcentimeter bilateral renal hypodense lesions are too small to characterize. There is no hydronephrosis on either side. The visualized ureters and urinary bladder appear unremarkable. Stomach/Bowel: There is extensive sigmoid diverticulosis with muscular hypertrophy. No active inflammatory changes. There are scattered colonic diverticula without active inflammation. There is no bowel obstruction or active inflammation. The appendix is normal. Lymphatic: No adenopathy. Reproductive: Mildly enlarged prostate gland measuring 5.7 cm in transverse axial diameter. The seminal vesicles are symmetric. Other: Small fat containing right inguinal hernia. Musculoskeletal: Degenerative changes of the spine. No acute osseous pathology. Review of the MIP images confirms the above findings. IMPRESSION: 1. No acute intrathoracic,  abdominal, or pelvic pathology. No aortic dissection. No CT evidence of pulmonary artery embolus. 2. Mildly dilated ascending aorta measuring 4.5 cm in diameter (previously 4.2 cm). Ascending thoracic aortic aneurysm. Recommend semi-annual imaging followup by CTA or MRA and referral to cardiothoracic surgery if not already obtained. This recommendation follows 2010 ACCF/AHA/AATS/ACR/ASA/SCA/SCAI/SIR/STS/SVM Guidelines for the Diagnosis and Management of Patients With Thoracic Aortic Disease. 2010; 121: X324-M010. 3. Colonic diverticulosis. No bowel obstruction or active inflammation. Normal appendix. 4. Partially exophytic left renal cyst with a thin calcified septum. Follow-up with CT in 6 months recommended. Electronically Signed   By: Anner Crete M.D.   On: 06/10/2018 01:54    Pending Labs Unresulted Labs (From admission, onward)    Start     Ordered   Signed and Held  CBC  (enoxaparin (LOVENOX)    CrCl >/= 30 ml/min)  Once,   R    Comments:  Baseline for enoxaparin therapy IF NOT ALREADY DRAWN.  Notify MD if PLT < 100 K.    Signed and Held   Signed and Held  Creatinine, serum  (enoxaparin (LOVENOX)    CrCl >/= 30 ml/min)  Once,   R    Comments:  Baseline for enoxaparin therapy IF NOT ALREADY DRAWN.    Signed and Held   Signed and Held  Creatinine, serum  (enoxaparin (LOVENOX)    CrCl >/= 30 ml/min)  Weekly,   R    Comments:  while on enoxaparin therapy    Signed and Held   Signed and Held  Troponin I - Now Then Q6H  Now then every 6 hours,   R     Signed and Held   Signed and Held  Hemoglobin A1c  Once,   R    Comments:  To assess prior glycemic control    Signed and Held          Vitals/Pain Today's Vitals   06/10/18 0515 06/10/18 0530 06/10/18 0600 06/10/18 0630  BP:  130/80 (!) 141/81 127/87  Pulse: 67 71 66 65  Resp:      Temp:      TempSrc:      SpO2: 97% 96% 96% 96%  Weight:      Height:      PainSc:        Isolation Precautions No active  isolations  Medications Medications  aspirin chewable tablet 324 mg (324 mg Oral Given 06/10/18 0016)  iohexol (OMNIPAQUE) 350 MG/ML injection 100 mL (100 mLs Intravenous Contrast Given 06/10/18 0125)    Mobility walks Low fall risk   Focused Assessments    R Recommendations: See Admitting Provider Note  Report given to:   Additional Notes:

## 2018-06-10 NOTE — Progress Notes (Signed)
Alert and oriented and has denied chest pain since admission.  IV removed and discharge instructions reviewed.  To pick up meds from pharmacy.  Wife to drive home

## 2018-10-28 ENCOUNTER — Other Ambulatory Visit: Payer: Self-pay | Admitting: Cardiothoracic Surgery

## 2018-10-28 DIAGNOSIS — I712 Thoracic aortic aneurysm, without rupture, unspecified: Secondary | ICD-10-CM

## 2018-12-02 ENCOUNTER — Ambulatory Visit: Payer: Medicare Other | Admitting: Cardiothoracic Surgery

## 2018-12-02 ENCOUNTER — Other Ambulatory Visit: Payer: Self-pay

## 2018-12-02 ENCOUNTER — Other Ambulatory Visit: Payer: Medicare Other

## 2018-12-02 ENCOUNTER — Encounter: Payer: Self-pay | Admitting: Cardiothoracic Surgery

## 2018-12-02 VITALS — BP 138/80 | HR 81 | Temp 97.7°F | Resp 18 | Ht 71.0 in | Wt 191.0 lb

## 2018-12-02 DIAGNOSIS — I712 Thoracic aortic aneurysm, without rupture, unspecified: Secondary | ICD-10-CM

## 2018-12-02 NOTE — Progress Notes (Signed)
[facility] Record [id number] Date of Birth: Jul 17, 1945  Referring: [name], MD Primary Care: [name], MD  Chief Complaint:    Chief Complaint  Patient presents with  . Thoracic Aortic Aneurysm    review most recent CTA CHEST 06/10/18    History of Present Illness:    Patient is a 73 year old male followed in the office for mildly dilated ascending aorta.  I originally saw him in February 2011 for a dilated acending aorta at that time there was approximately 4.4 cm in size and had been known about for 4-5 years. He has had  a cardiac catheterization 02/21/11 that showed no significant stenosis. Last  Echocardiogram in 2010 noted trileaflet aortic valve.  The patient denies significant shortness of breath dyspnea on exertion or chest discomfort.  After he was seen with CTA in March 2020 he developed substernal chest pain and was evaluated in the emergency room in April 2020, follow-up CTA showed no evidence of dissection or change in his mildly dilated ascending aorta.  Patient has had no recurrent episodes of chest pain since that time.  He has no family history of abnormal aorta aortic aneurysms or sudden death from unknown cause.  Current Activity/ Functional Status:  Patient is independent with mobility/ambulation, transfers, ADL's, IADL's.  Zubrod Score: At the time of surgery this patient's most appropriate activity status/level should be described as: [x]  Normal activity, no symptoms []  Symptoms, fully ambulatory []  Symptoms, in bed less than or equal to 50% of the time []  Symptoms, in bed greater than 50% of the time but less than 100% []  Bedridden []  Moribund   Past Medical History:  Diagnosis Date  . Hyperlipidemia 06/10/2018  . Hypertension 06/10/2018    Past Surgical History:  Procedure Laterality Date  . CARDIAC CATHETERIZATION    . CARDIAC CATHETERIZATION  02/21/2011   Normal Coronary Arteries  .  COLONOSCOPY  05/15/2011   Procedure: COLONOSCOPY;  Surgeon: [name], MD;  Location: [facility];  Service: Endoscopy;  Laterality: N/A;  930  . DOPPLER ECHOCARDIOGRAPHY  03/20/2008   EF>55%  . LEFT HEART CATHETERIZATION WITH CORONARY ANGIOGRAM N/A 02/21/2011   Procedure: LEFT HEART CATHETERIZATION WITH CORONARY ANGIOGRAM;  Surgeon: [name], MD;  Location: [facility];  Service: Cardiovascular;  Laterality: N/A;  . NM MYOVIEW LTD  02/04/2011   EF 58%    Family History  Problem Relation Age of Onset  . Colon cancer Neg Hx   father died ruptured stomach at age 70 ulcer , three brothers have has heart surgery no history dissection  Social History   Socioeconomic History  . Marital status: Married    Spouse name: Not on file  . Number of children: Not on file  . Years of education: Not on file  . Highest education level: Not on file  Occupational History  . Not on file  Social Needs  . Financial resource strain: Not on file  . Food insecurity    Worry: Not on file    Inability: Not on file  . Transportation needs    Medical: Not on file    Non-medical: Not on file  Tobacco Use  . Smoking status: Never Smoker  . Smokeless tobacco: Never Used  Substance and Sexual Activity  . Alcohol use: No  . Drug use: No  . Sexual activity: Yes  Partners: Female    Birth control/protection: None  Lifestyle  . Physical activity    Days per week: Not on file    Minutes per session: Not on file  . Stress: Not on file  Relationships  . Social Herbalist on phone: Not on file    Gets together: Not on file    Attends religious service: Not on file    Active member of club or organization: Not on file    Attends meetings of clubs or organizations: Not on file    Relationship status: Not on file  . Intimate partner violence    Fear of current or ex partner: Not on file    Emotionally abused: Not on file    Physically abused: Not on file    Forced sexual  activity: Not on file  Other Topics Concern  . Not on file  Social History Narrative  . Not on file    Social History   Tobacco Use  Smoking Status Never Smoker  Smokeless Tobacco Never Used    Social History   Substance and Sexual Activity  Alcohol Use No     Allergies  Allergen Reactions  . Quinolones     Current Outpatient Medications  Medication Sig Dispense Refill  . aspirin EC 81 MG tablet Take 81 mg by mouth daily.      Marland Kitchen atorvastatin (LIPITOR) 40 MG tablet Take 1 tablet by mouth daily.  3  . CINNAMON PO Take 1,000 mg by mouth daily.     . hydrochlorothiazide (HYDRODIURIL) 12.5 MG tablet Take 12.5 mg by mouth daily.      Marland Kitchen lisinopril (PRINIVIL,ZESTRIL) 10 MG tablet Take 10 mg by mouth daily.    . metFORMIN (GLUCOPHAGE) 500 MG tablet Take 1 tablet (500 mg total) by mouth daily with breakfast.    . metoprolol succinate (TOPROL-XL) 25 MG 24 hr tablet TAKE ONE-HALF TABLET BY  MOUTH DAILY (Patient taking differently: 12.5 mg. ) 45 tablet 3  . naproxen sodium (ANAPROX) 220 MG tablet Take 220 mg by mouth every 8 (eight) hours as needed. For pain    . potassium chloride (MICRO-K) 10 MEQ CR capsule Take 10 mEq by mouth daily as needed.    . vitamin B-12 (CYANOCOBALAMIN) 1000 MCG tablet Take 1,000 mcg by mouth daily.      . pantoprazole (PROTONIX) 40 MG tablet Take 1 tablet (40 mg total) by mouth daily. 30 tablet 1   No current facility-administered medications for this visit.        Review of Systems:     Cardiac Review of Systems: Y or N  Chest Pain [ n ]  Resting SOB [n   Exertional SOB  [n]  Orthopnea [ n ]   Pedal Edema [n ]    Palpitations [n] Syncope  [n ]   Presyncope [n  ]  General Review of Systems: [Y] = yes [  ]=no Constitional: recent weight change [  ]; anorexia [  ]; fatigue [ n ]; nausea [  ]; night sweats [  ]; fever [  ]; or chills [  ];  Dental: poor dentition[  ]; Last Dentist visit: twice a year  Eye : blurred vision [  ]; diplopia [   ]; vision changes [  ];  Amaurosis fugax[  ]; Resp: cough [  ];  wheezing[  ];  hemoptysis[  ]; shortness of breath[  ]; paroxysmal nocturnal dyspnea[  ]; dyspnea on exertion[  ]; or orthopnea[  ];  GI:  gallstones[  ], vomiting[  ];  dysphagia[  ]; melena[  ];  hematochezia [  ]; heartburn[  ];   Hx of  Colonoscopy[ y ]; GU: kidney stones [  ]; hematuria[  ];   dysuria [  ];  nocturia[  ];  history of     obstruction [  ]; urinary frequency [  ]             Skin: rash, swelling[  ];, hair loss[  ];  peripheral edema[  ];  or itching[  ]; Musculosketetal: myalgias[  ];  joint swelling[  ];  joint erythema[  ];  joint pain[  ];  back pain[  ];  Heme/Lymph: bruising[  ];  bleeding[  ];  anemia[  ];  Neuro: TIA[  ];  headaches[  ];  stroke[  ];  vertigo[  ];  seizures[  ];   paresthesias[  ];  difficulty walking[  ];  Psych:depression[  ]; anxiety[  ];  Endocrine: diabetes[  ];  thyroid dysfunction[  ];  Immunizations: Flu Jazmín.Cullens ]; Pneumococcal[N ];  Other:  Physical Exam: BP 138/80 (BP Location: Left Arm, Patient Position: Sitting, Cuff Size: Normal)   Pulse 81   Temp 97.7 F (36.5 C)   Resp 18   Ht 5\' 11"  (1.803 m)   Wt 191 lb (86.6 kg)   SpO2 95% Comment: RA  BMI 26.64 kg/m   General appearance: alert, cooperative, appears stated age and no distress Head: Normocephalic, without obvious abnormality, atraumatic Neck: no adenopathy, no carotid bruit, no JVD, supple, symmetrical, trachea midline and thyroid not enlarged, symmetric, no tenderness/mass/nodules Lymph nodes: Cervical, supraclavicular, and axillary nodes normal. Resp: clear to auscultation bilaterally Back: symmetric, no curvature. ROM normal. No CVA tenderness. Cardio: regular rate and rhythm, S1, S2 normal, no murmur, click, rub or gallop GI: soft, non-tender; bowel sounds normal; no masses,  no  organomegaly Extremities: extremities normal, atraumatic, no cyanosis or edema and Homans sign is negative, no sign of DVT Neurologic: Grossly normal Palpable DP and PT pulses bilaterally No palpable popliteal or abdominal aneurysm appreciated   Diagnostic Studies & Laboratory data:     Recent Radiology Findings:   No results found.    RADIOLOGY REPORT*  Clinical Data: Evaluate ascending aortic aneurysm  CT ANGIOGRAPHY CHEST  Technique: Multidetector CT imaging of the chest using the  standard protocol during bolus administration of intravenous  contrast. Multiplanar reconstructed images including MIPs were  obtained and reviewed to evaluate the vascular anatomy.  Contrast: 151mL OMNIPAQUE IOHEXOL 350 MG/ML SOLN  Comparison: Chest CT - 03/12/2010; 03/24/2008  Vascular Findings:  There is grossly unchanged fusiform ectasia of the ascending  thoracic aorta with measurements as follows. The aorta tapers to a  normal caliber at the level of the aortic arch. 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 Normal caliber of the  descending thoracic aorta. No evidence of thoracic aortic  dissection. No periaortic stranding. Minimal calcifications within  the aortic valve annulus.  Sinotubular junction: 36 mm as measured in greatest oblique coronal  dimension, unchanged.  Proximal ascending aorta: 42 x 44 mm as measured in greatest  oblique axial dimension at the level of the main pulmonary artery.  The ascending thoracic aorta measures approximately 44 mm in  greatest oblique coronal dimension. These measurements are grossly  unchanged since the 03/2008 examination  Aortic arch aorta: 26 mm as measured in greatest oblique sagittal  dimension.  Proximal descending thoracic aorta: 32 x 30 mm as measured in  greatest oblique axial dimension at the level of the main pulmonary  artery, unchanged.  Distal descending thoracic aorta: 26 x 25 mm as measured in  greatest oblique  axial dimension at the level of the diaphragmatic  hiatus, unchanged.  Borderline cardiomegaly. Minimal coronary artery calcifications.  No pericardial effusion. Although this examination was not  tailored for evaluation of the pulmonary arteries, there are no  discrete filling defects within the central pulmonary arterial tree  to suggest pulmonary embolism. Normal caliber of the main  pulmonary artery.  ----------------------------------------------  Nonvascular findings:  Minimal dependent subpleural ground-glass atelectasis. No focal  airspace opacities. No pleural effusion or pneumothorax. The  central pulmonary airways are patent. The punctate granuloma in  the right middle lobe (image 43, series six). No mediastinal,  hilar or axillary lymphadenopathy.  Early arterial phase evaluation of the upper abdomen is  unremarkable.  No acute or aggressive osseous abnormalities.  IMPRESSION:  Unchanged fusiform ectasia of the ascending thoracic aorta  measuring approximately 44 mm in greatest oblique axial dimension,  grossly unchanged since the 03/2008 examination.  Original Report Authenticated By: Jake Seats, MD   Recent Lab Findings: Lab Results  Component Value Date   WBC 7.1 06/09/2018   HGB 13.6 06/09/2018   HCT 40.9 06/09/2018   PLT 211 06/09/2018   GLUCOSE 165 (H) 06/09/2018   NA 138 06/09/2018   K 3.4 (L) 06/09/2018   CL 102 06/09/2018   CREATININE 1.00 06/09/2018   BUN 14 06/09/2018   CO2 27 06/09/2018   HGBA1C 7.9 (H) 06/10/2018   Aortic Size Index=       4.3  /Body surface area is 2.08 meters squared. =2.13  < 2.75 cm/m2      4% risk per year 2.75 to 4.25          8% risk per year > 4.25 cm/m2    20% risk per year     Assessment / Plan:   Patient is asymptomatic with stable appearing dilatation of the ascending aorta less than 4.3cm stable in size, in the setting of a trileaflet aortic valve and no family history of aortic dissection or a sending aortic  aneurysms.  We will plan to see the patient back in 1 year with a CTA of the chest       According to the 2010 ACC/AHA guidelines, we recommend patients with thoracic aortic disease to maintain a LDL of less than 70 and a HDL of greater than 50. We recommend their blood pressure to remain less than 135/85.    Grace Isaac MD  Beeper [phone] Office [phone] 12/02/2018 1:51 PM

## 2018-12-19 ENCOUNTER — Other Ambulatory Visit: Payer: Self-pay | Admitting: Cardiovascular Disease

## 2019-01-18 ENCOUNTER — Other Ambulatory Visit: Payer: Self-pay

## 2019-01-18 ENCOUNTER — Encounter: Payer: Self-pay | Admitting: Cardiovascular Disease

## 2019-01-18 ENCOUNTER — Ambulatory Visit: Payer: Medicare Other | Admitting: Cardiovascular Disease

## 2019-01-18 VITALS — BP 168/91 | HR 60 | Temp 98.4°F | Ht 71.0 in | Wt 193.2 lb

## 2019-01-18 DIAGNOSIS — I712 Thoracic aortic aneurysm, without rupture: Secondary | ICD-10-CM | POA: Diagnosis not present

## 2019-01-18 DIAGNOSIS — E119 Type 2 diabetes mellitus without complications: Secondary | ICD-10-CM | POA: Diagnosis not present

## 2019-01-18 DIAGNOSIS — E7800 Pure hypercholesterolemia, unspecified: Secondary | ICD-10-CM | POA: Diagnosis not present

## 2019-01-18 DIAGNOSIS — I7121 Aneurysm of the ascending aorta, without rupture: Secondary | ICD-10-CM

## 2019-01-18 DIAGNOSIS — I10 Essential (primary) hypertension: Secondary | ICD-10-CM

## 2019-01-18 NOTE — Progress Notes (Signed)
Cardiology Office Note:    Date:  01/19/2019   ID:  Brian Gray, DOB 1945-05-18, MRN [id number]  PCP:  [name], MD  Cardiologist:  [name], MD    Referring MD: [name], MD   chief complaint: F/u ascending aortic aneurysm, hypertension, hyperlipidemia  History of Present Illness:    Brian Gray is a 73 y.o. male with a hx of mild dilation of the ascending aorta (4.4 cm, stable since 2010, most recent CTA April 2020), history of false positive nuclear stress test leading to a coronary angiogram in 2012 that showed no significant stenoses, treated hyperlipidemia, hypertension and borderline diabetes mellitus, returning for follow-up.  He has no cardiovascular complaints and generally doing quite well.  He had some neuropathic problems and was diagnosed with vitamin B12 deficiency, symptoms improved.  Diabetes control has deteriorated a little bit and his most recent hemoglobin A1c was 7.9%.  He was briefly hospitalized with atypical chest pain in April.  The CT angiogram showed no change in his tubular ascending thoracic aortic aneurysm measuring 4.3 x 4.2 cm.  No dissection was seen.  Cardiac enzymes are normal.  Symptoms improved with antacids.  The patient specifically denies any chest pain at rest exertion, dyspnea at rest or with exertion, orthopnea, paroxysmal nocturnal dyspnea, syncope, palpitations, focal neurological deficits, intermittent claudication, lower extremity edema, unexplained weight gain, cough, hemoptysis or wheezing.  I do not have his most recent lipid profile, but he reports this was checked by Dr. Karie Kirks and was satisfactory.  Past Medical History:  Diagnosis Date  . Hyperlipidemia 06/10/2018  . Hypertension 06/10/2018    Past Surgical History:  Procedure Laterality Date  . CARDIAC CATHETERIZATION    . CARDIAC CATHETERIZATION  02/21/2011   Normal Coronary Arteries  . COLONOSCOPY  05/15/2011   Procedure: COLONOSCOPY;  Surgeon: [name], MD;  Location: [facility];  Service: Endoscopy;  Laterality: N/A;  930  . DOPPLER ECHOCARDIOGRAPHY  03/20/2008   EF>55%  . LEFT HEART CATHETERIZATION WITH CORONARY ANGIOGRAM N/A 02/21/2011   Procedure: LEFT HEART CATHETERIZATION WITH CORONARY ANGIOGRAM;  Surgeon: [name], MD;  Location: [facility];  Service: Cardiovascular;  Laterality: N/A;  . NM MYOVIEW LTD  02/04/2011   EF 58%    Current Medications: Current Meds  Medication Sig  . aspirin EC 81 MG tablet Take 81 mg by mouth daily.    Marland Kitchen atorvastatin (LIPITOR) 40 MG tablet Take 1 tablet by mouth daily.  Marland Kitchen CINNAMON PO Take 1,000 mg by mouth daily.   . hydrochlorothiazide (HYDRODIURIL) 12.5 MG tablet Take 12.5 mg by mouth daily.    Marland Kitchen lisinopril (PRINIVIL,ZESTRIL) 10 MG tablet Take 10 mg by mouth daily.  . metFORMIN (GLUCOPHAGE) 500 MG tablet Take 1 tablet (500 mg total) by mouth daily with breakfast.  . metoprolol succinate (TOPROL-XL) 25 MG 24 hr tablet TAKE ONE-HALF TABLET BY  MOUTH DAILY  . naproxen sodium (ANAPROX) 220 MG tablet Take 220 mg by mouth every 8 (eight) hours as needed. For pain  . pantoprazole (PROTONIX) 40 MG tablet Take 1 tablet (40 mg total) by mouth daily.  . potassium chloride (MICRO-K) 10 MEQ CR capsule Take 10 mEq by mouth daily as needed.  . vitamin B-12 (CYANOCOBALAMIN) 1000 MCG tablet Take 1,000 mcg by mouth daily.       Allergies:   Quinolones   Social History   Socioeconomic History  . Marital status: Married    Spouse name: Not on file  .  Number of children: Not on file  . Years of education: Not on file  . Highest education level: Not on file  Occupational History  . Not on file  Social Needs  . Financial resource strain: Not on file  . Food insecurity    Worry: Not on file    Inability: Not on file  . Transportation needs    Medical: Not on file    Non-medical: Not on file  Tobacco Use  . Smoking status: Never Smoker  . Smokeless tobacco: Never Used  Substance and  Sexual Activity  . Alcohol use: No  . Drug use: No  . Sexual activity: Yes    Partners: Female    Birth control/protection: None  Lifestyle  . Physical activity    Days per week: Not on file    Minutes per session: Not on file  . Stress: Not on file  Relationships  . Social Herbalist on phone: Not on file    Gets together: Not on file    Attends religious service: Not on file    Active member of club or organization: Not on file    Attends meetings of clubs or organizations: Not on file    Relationship status: Not on file  Other Topics Concern  . Not on file  Social History Narrative  . Not on file     Family History: The patient's family history is negative for Colon cancer.,  Aortic aneurysm/dissection or sudden death ROS:   Please see the history of present illness.    All other systems are reviewed and are negative.  EKGs/Labs/Other Studies Reviewed:    The following studies were reviewed today: Hospitalization records and CT angiography from April 2020 Notes from Dr. [name] September 2020.  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, QTC 501 ms  Recent Labs: Hemoglobin A1c 7.9% Recent Lipid Panel Not available  Physical Exam:    VS:  BP (!) 168/91   Pulse 60   Temp 98.4 F (36.9 C)   Ht 5\' 11"  (1.803 m)   Wt 193 lb 3.2 oz (87.6 kg)   SpO2 99%   BMI 26.95 kg/m     Wt Readings from Last 3 Encounters:  01/18/19 193 lb 3.2 oz (87.6 kg)  12/02/18 191 lb (86.6 kg)  06/10/18 195 lb 11.2 oz (88.8 kg)    General: Alert, oriented x3, no distress, minimally overweight Head: no evidence of trauma, PERRL, EOMI, no exophtalmos or lid lag, no myxedema, no xanthelasma; normal ears, nose and oropharynx Neck: normal jugular venous pulsations and no hepatojugular reflux; brisk carotid pulses without delay and no carotid bruits Chest: clear to auscultation, no signs of consolidation by percussion or  palpation, normal fremitus, symmetrical and full respiratory excursions Cardiovascular: normal position and quality of the apical impulse, regular rhythm, normal first and second heart sounds, no murmurs, rubs or gallops Abdomen: no tenderness or distention, no masses by palpation, no abnormal pulsatility or arterial bruits, normal bowel sounds, no hepatosplenomegaly Extremities: no clubbing, cyanosis or edema; 2+ radial, ulnar and brachial pulses bilaterally; 2+ right femoral, posterior tibial and dorsalis pedis pulses; 2+ left femoral, posterior tibial and dorsalis pedis pulses; no subclavian or femoral bruits Neurological: grossly nonfocal Psych: Normal mood and affect    ASSESSMENT:    1. Ascending aortic aneurysm ([place])   2. Essential hypertension   3. Hypercholesterolemia   4. Diabetes mellitus type 2  in nonobese [facility])    PLAN:    In order of problems listed above:  1. AATA: Stable in size since 2010 and asymptomatic.  Follow-up with Dr. [name] Sept 2020. Aortic Size Index =2.13 2. HTN: His blood pressures was worrisomely high today when he first checked in but was getting better by the time I rechecked it down to 140/85.  Recently, when he saw Dr. [name], his blood pressure was 130/80.  No changes made to his medications today.  Asked him to call me if his blood pressure remains elevated at home.  Discussed the fact that tight blood pressure control is the best way we can avoid aneurysm enlargement.  I asked him to call me if his blood pressure is over 130/80 consistently at home. 3. HLP: Need to get his lipid profile results. 4. DM: Glycemic control has worsened further.  Discussed low carbohydrate diet, regular exercise (avoid straining/isometric exercise), weight loss.   Medication Adjustments/Labs and Tests Ordered: Current medicines are reviewed at length with the patient today.  Concerns regarding medicines are outlined above.  Orders Placed This Encounter  Procedures  .  EKG 12-Lead   No orders of the defined types were placed in this encounter.   Signed, [name], MD  01/19/2019 9:32 PM    [place]

## 2019-01-18 NOTE — Patient Instructions (Signed)

## 2019-01-19 ENCOUNTER — Encounter: Payer: Self-pay | Admitting: Cardiovascular Disease

## 2019-03-10 ENCOUNTER — Other Ambulatory Visit: Payer: Self-pay | Admitting: Cardiovascular Disease

## 2019-10-01 IMAGING — CT CT ANGIO CHEST-ABD-PELV FOR DISSECTION W/ AND WO/W CM
2 of 7 series · 10 of 36 positions shown, 15 images · IV contrast (Isovue)
Comparison: Chest radiograph dated 06/09/2018 and CT dated
05/20/2018

CLINICAL DATA: 72-year-old male with chest pain. Concern for acute
aortic syndrome.

EXAM:
CT ANGIOGRAPHY CHEST, ABDOMEN AND PELVIS
TECHNIQUE: Multidetector CT imaging through the chest, abdomen and pelvis was
performed using the standard protocol during bolus administration of
intravenous contrast. Multiplanar reconstructed images and MIPs were
obtained and reviewed to evaluate the vascular anatomy.
CONTRAST:  100mL OMNIPAQUE IOHEXOL 350 MG/ML SOLN

[Series 5: dissection axial arterial · axial · arterial · 0.81mm/px · z∈[-735,-207]mm · 9 of 212 slices shown, 13 images]
[im 18/212  mediastinal]
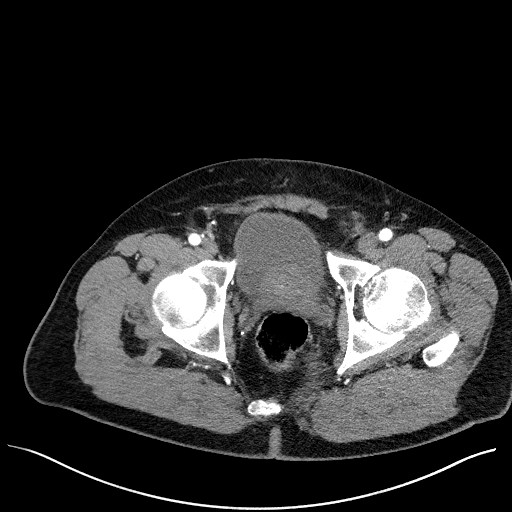
[im 18/212  bone]
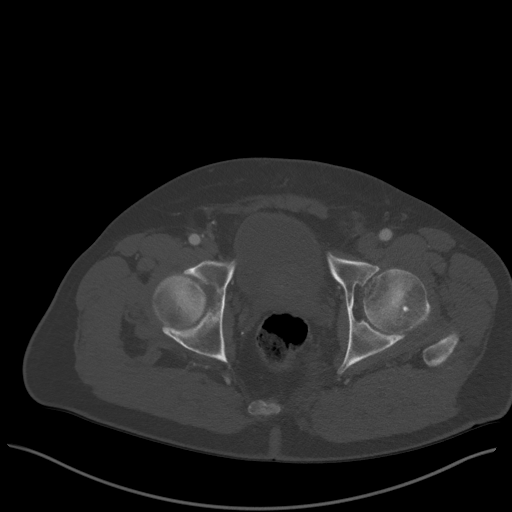
[im 53/212  mediastinal]
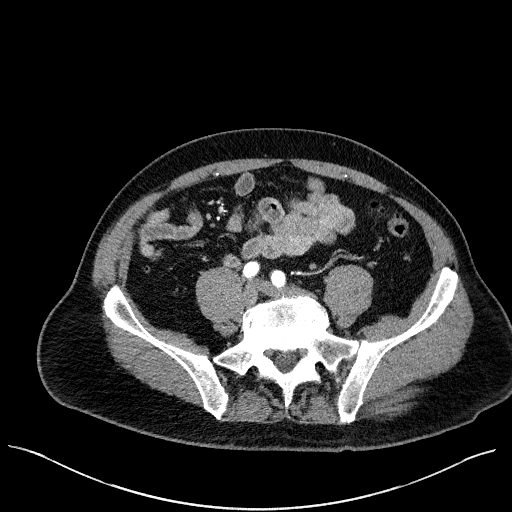
[im 71/212  mediastinal]
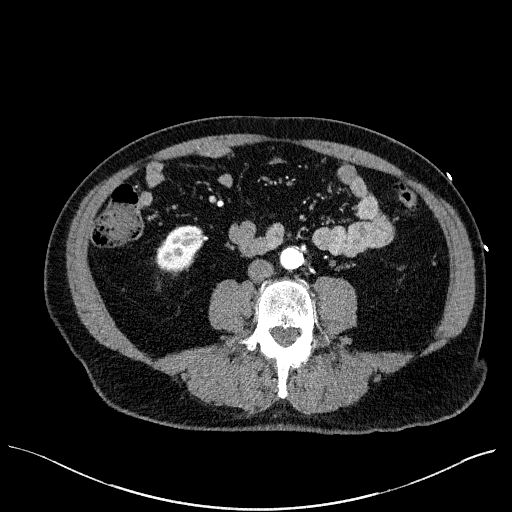
[im 88/212  mediastinal]
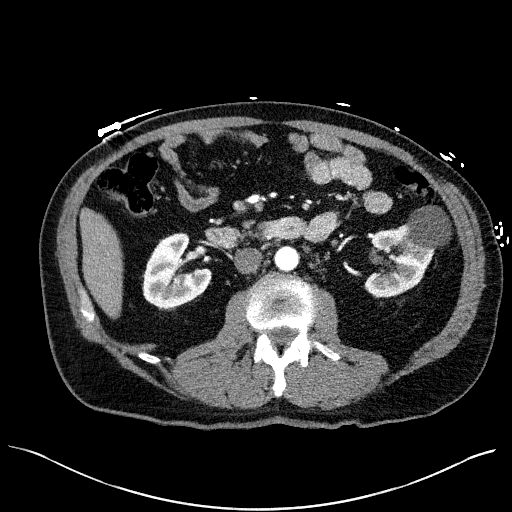
[im 124/212  mediastinal]
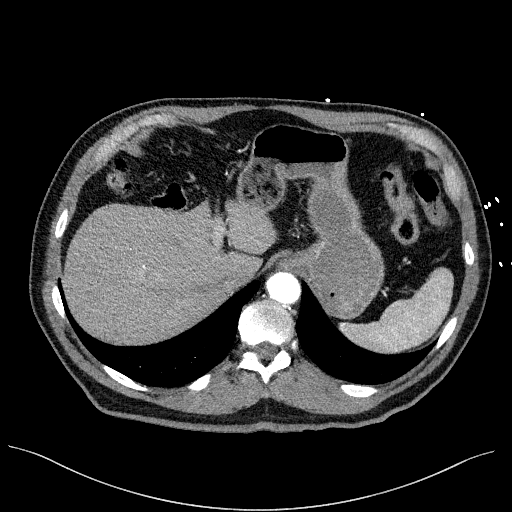
[im 141/212  mediastinal]
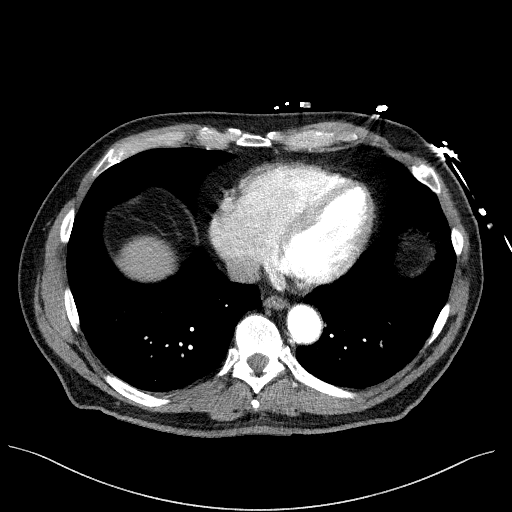
[im 141/212  lung]
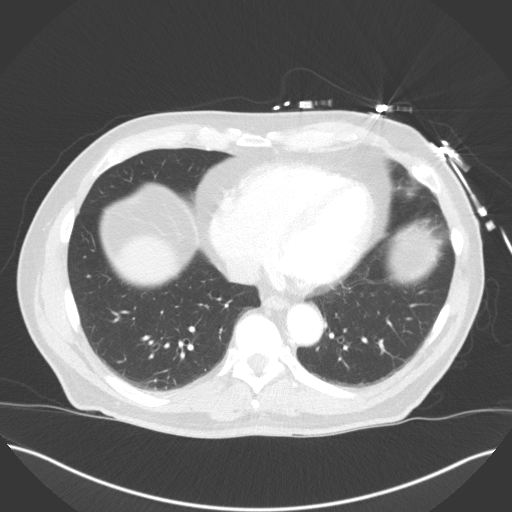
[im 159/212  mediastinal]
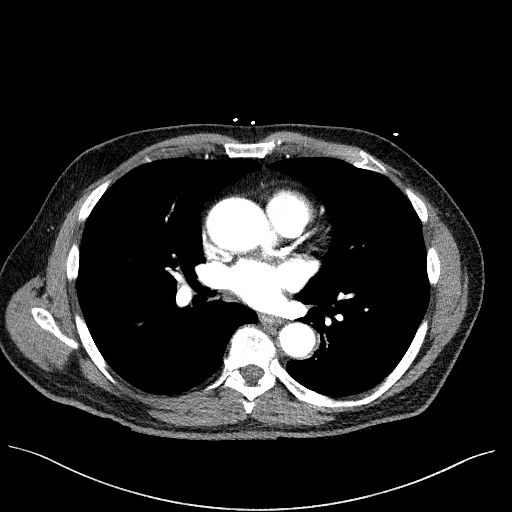
[im 159/212  lung]
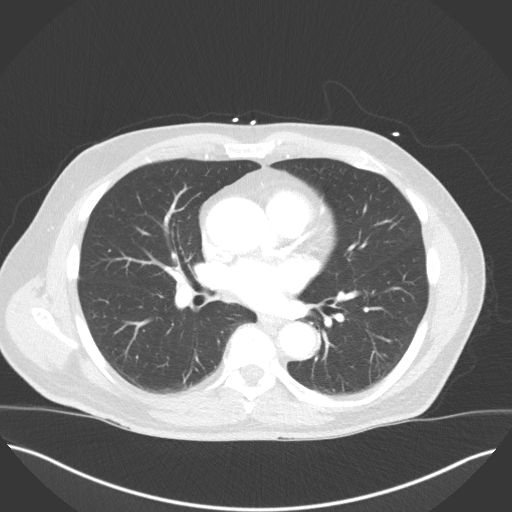
[im 176/212  lung]
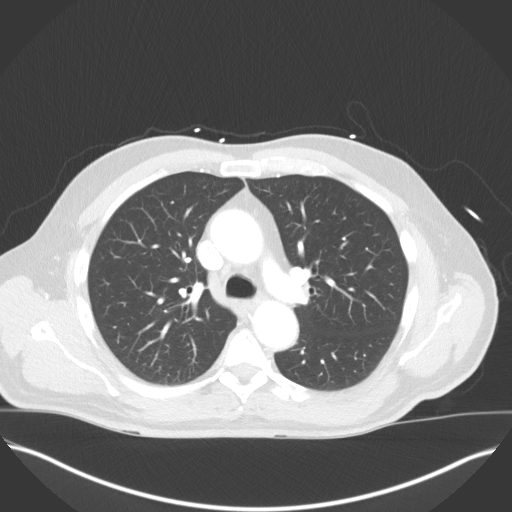
[im 194/212  mediastinal]
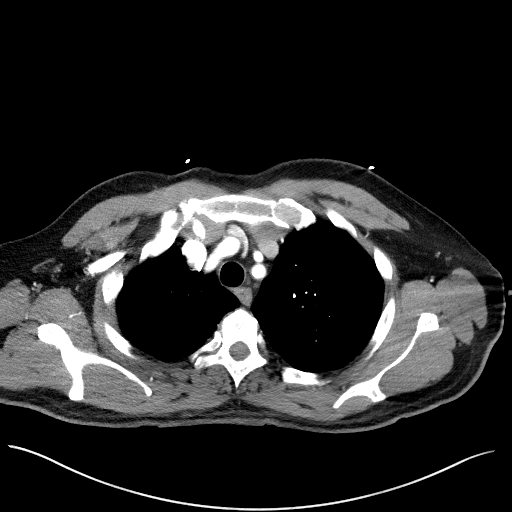
[im 194/212  lung]
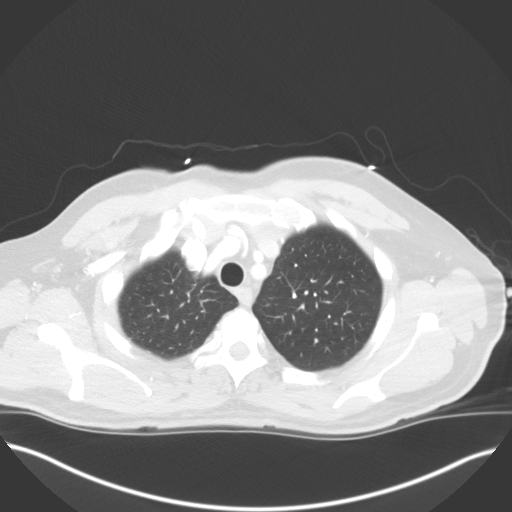

[Series 9: coronals · coronal · 0.86mm/px · 1 of 136 slices shown, 2 images]
[im 68/136  mediastinal]
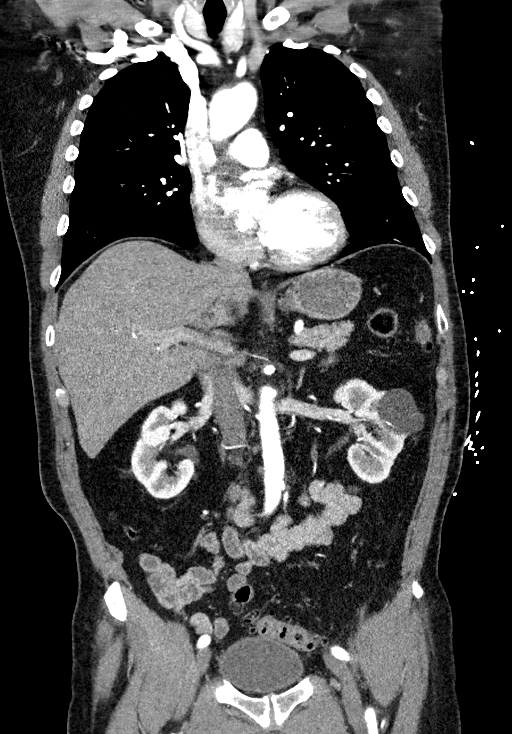
[im 68/136  bone]
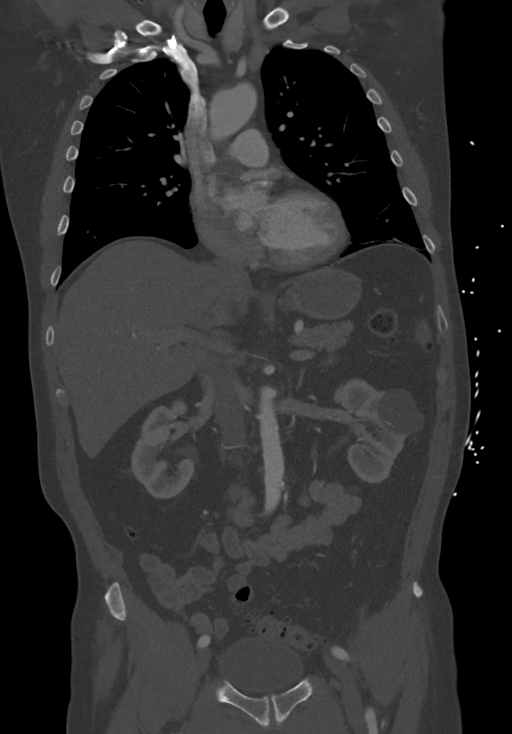

[10 of 36 positions shown; findings below may reference images not displayed]

FINDINGS: CTA CHEST FINDINGS

Cardiovascular: There is no cardiomegaly or pericardial effusion.
The thoracic aorta is slightly tortuous. The ascending aorta
measures up to 4.5 cm in diameter (previously approximately 4.2 cm
on CT of 4394). No aortic dissection. There is a diminutive right
vertebral artery. The remainder of the origins of the great vessels
of the aortic arch appear patent. No pulmonary artery embolus
identified.

Mediastinum/Nodes: There is no hilar or mediastinal adenopathy. The
esophagus and the thyroid gland are grossly unremarkable. No
mediastinal fluid collection.

Lungs/Pleura: The lungs are clear. There is no pleural effusion or
pneumothorax. The central airways are patent.

Musculoskeletal: No acute osseous pathology.

Review of the MIP images confirms the above findings.

CTA ABDOMEN AND PELVIS FINDINGS

VASCULAR

Aorta: Mild atherosclerotic disease. No aneurysmal dilatation or
dissection.

Celiac: Patent without evidence of aneurysm, dissection, vasculitis
or significant stenosis.

SMA: Patent without evidence of aneurysm, dissection, vasculitis or
significant stenosis.

Renals: Both renal arteries are patent without evidence of aneurysm,
dissection, vasculitis, fibromuscular dysplasia or significant
stenosis. Two additional accessory renal arteries noted in addition
to the main renal artery on each side.

IMA: Patent without evidence of aneurysm, dissection, vasculitis or
significant stenosis.

Inflow: Mild atherosclerotic disease. No aneurysmal dilatation or
dissection.

Veins: No obvious venous abnormality within the limitations of this
arterial phase study.

Review of the MIP images confirms the above findings.

NON-VASCULAR

No intra-abdominal free air or free fluid.

Hepatobiliary: No focal liver abnormality is seen. No gallstones,
gallbladder wall thickening, or biliary dilatation.

Pancreas: Unremarkable. No pancreatic ductal dilatation or
surrounding inflammatory changes.

Spleen: Normal in size without focal abnormality.

Adrenals/Urinary Tract: The adrenal glands are unremarkable. There
is a bilobed 4.6 x 4.8 cm predominantly exophytic cyst in the
interpolar aspect of the left kidney with a thin calcification.
Additional subcentimeter bilateral renal hypodense lesions are too
small to characterize. There is no hydronephrosis on either side.
The visualized ureters and urinary bladder appear unremarkable.

Stomach/Bowel: There is extensive sigmoid diverticulosis with
muscular hypertrophy. No active inflammatory changes. There are
scattered colonic diverticula without active inflammation. There is
no bowel obstruction or active inflammation. The appendix is normal.

Lymphatic: No adenopathy.

Reproductive: Mildly enlarged prostate gland measuring 5.7 cm in
transverse axial diameter. The seminal vesicles are symmetric.

Other: Small fat containing right inguinal hernia.

Musculoskeletal: Degenerative changes of the spine. No acute osseous
pathology.

Review of the MIP images confirms the above findings.
IMPRESSION: 1. No acute intrathoracic, abdominal, or pelvic pathology. No aortic
dissection. No CT evidence of pulmonary artery embolus.
2. Mildly dilated ascending aorta measuring 4.5 cm in diameter
(previously 4.2 cm). Ascending thoracic aortic aneurysm. Recommend
semi-annual imaging followup by CTA or MRA and referral to
cardiothoracic surgery if not already obtained. This recommendation
follows 2939 ACCF/AHA/AATS/ACR/ASA/SCA/MUTUMBAJOY/CARATI/KIROZ/KHOFFI Guidelines
for the Diagnosis and Management of Patients With Thoracic Aortic
Disease. 2939; 121: e266-e369.
3. Colonic diverticulosis. No bowel obstruction or active
inflammation. Normal appendix.
4. Partially exophytic left renal cyst with a thin calcified septum.
Follow-up with CT in 6 months recommended.

## 2019-10-31 ENCOUNTER — Other Ambulatory Visit: Payer: Self-pay | Admitting: Cardiothoracic Surgery

## 2019-10-31 DIAGNOSIS — I712 Thoracic aortic aneurysm, without rupture, unspecified: Secondary | ICD-10-CM

## 2019-10-31 NOTE — Progress Notes (Unsigned)
c 

## 2019-12-08 ENCOUNTER — Other Ambulatory Visit: Payer: Medicare Other

## 2019-12-15 ENCOUNTER — Encounter: Payer: Self-pay | Admitting: Cardiothoracic Surgery

## 2019-12-15 ENCOUNTER — Other Ambulatory Visit: Payer: Self-pay

## 2019-12-15 ENCOUNTER — Ambulatory Visit
Admission: RE | Admit: 2019-12-15 | Discharge: 2019-12-15 | Disposition: A | Discharge status: Home / Self Care | Payer: Medicare Other | Source: Ambulatory Visit | Attending: Cardiothoracic Surgery | Admitting: Cardiothoracic Surgery

## 2019-12-15 ENCOUNTER — Ambulatory Visit: Payer: Medicare Other | Admitting: Cardiothoracic Surgery

## 2019-12-15 VITALS — BP 167/89 | HR 65 | Temp 97.9°F | Resp 18 | Ht 71.0 in | Wt 190.4 lb

## 2019-12-15 DIAGNOSIS — I77810 Thoracic aortic ectasia: Secondary | ICD-10-CM | POA: Diagnosis not present

## 2019-12-15 DIAGNOSIS — I712 Thoracic aortic aneurysm, without rupture, unspecified: Secondary | ICD-10-CM

## 2019-12-15 MED ORDER — IOPAMIDOL (ISOVUE-370) INJECTION 76%
75.0000 mL | Freq: Once | INTRAVENOUS | Status: AC | PRN
  Administered 2019-12-15: 75 mL via INTRAVENOUS

## 2019-12-15 NOTE — Progress Notes (Signed)
[facility] Record #[id number] Date of Birth: 1945/07/12  Referring: [name], MD Primary Care: [name], MD  Chief Complaint:    Chief Complaint  Patient presents with  . Thoracic Aortic Aneurysm    yearly f/u with CTA chest    History of Present Illness:    Patient is a 74 year old male followed in the office for mildly dilated ascending aorta.  I originally saw him in February 2011 for a dilated acending aorta at that time there was approximately 4.4 cm in size and had been known about for 4-5 years. He has had  a cardiac catheterization 02/21/11 that showed no significant stenosis. Last  Echocardiogram in 2010 noted trileaflet aortic valve.  Overall the patient feels well denies any of significant shortness of breath with exertion or chest discomfort.   He was seen in the emergency room in April 2020 for chest pain and a follow-up CTA of the chest was done at that time without evidence of dissection or change in size of his aorta.  He has had no recurrent chest pain after that that time.  He has no family history of abnormal aorta aortic aneurysms or sudden death from unknown cause.  Current Activity/ Functional Status:  Patient is independent with mobility/ambulation, transfers, ADL's, IADL's.  Zubrod Score: At the time of surgery this patient's most appropriate activity status/level should be described as: [x]  Normal activity, no symptoms []  Symptoms, fully ambulatory []  Symptoms, in bed less than or equal to 50% of the time []  Symptoms, in bed greater than 50% of the time but less than 100% []  Bedridden []  Moribund   Past Medical History:  Diagnosis Date  . Hyperlipidemia 06/10/2018  . Hypertension 06/10/2018    Past Surgical History:  Procedure Laterality Date  . CARDIAC CATHETERIZATION    . CARDIAC CATHETERIZATION  02/21/2011   Normal Coronary Arteries  . COLONOSCOPY  05/15/2011   Procedure:  COLONOSCOPY;  Surgeon: [name], MD;  Location: [facility];  Service: Endoscopy;  Laterality: N/A;  930  . DOPPLER ECHOCARDIOGRAPHY  03/20/2008   EF>55%  . LEFT HEART CATHETERIZATION WITH CORONARY ANGIOGRAM N/A 02/21/2011   Procedure: LEFT HEART CATHETERIZATION WITH CORONARY ANGIOGRAM;  Surgeon: [name], MD;  Location: [facility];  Service: Cardiovascular;  Laterality: N/A;  . NM MYOVIEW LTD  02/04/2011   EF 58%    Family History  Problem Relation Age of Onset  . Colon cancer Neg Hx   father died ruptured stomach at age 42 ulcer , three brothers have has heart surgery no history dissection  Social History   Socioeconomic History  . Marital status: Married    Spouse name: Not on file  . Number of children: Not on file  . Years of education: Not on file  . Highest education level: Not on file  Occupational History  . Not on file  Tobacco Use  . Smoking status: Never Smoker  . Smokeless tobacco: Never Used  Substance and Sexual Activity  . Alcohol use: No  . Drug use: No  . Sexual activity: Yes    Partners: Female    Birth control/protection: None  Other Topics Concern  . Not on file  Social History Narrative  . Not on file   Social Determinants of Health   Financial Resource Strain:   . Difficulty of Paying Living Expenses:  Not on file  Food Insecurity:   . Worried About Charity fundraiser in the Last Year: Not on file  . Ran Out of Food in the Last Year: Not on file  Transportation Needs:   . Lack of Transportation (Medical): Not on file  . Lack of Transportation (Non-Medical): Not on file  Physical Activity:   . Days of Exercise per Week: Not on file  . Minutes of Exercise per Session: Not on file  Stress:   . Feeling of Stress : Not on file  Social Connections:   . Frequency of Communication with Friends and Family: Not on file  . Frequency of Social Gatherings with Friends and Family: Not on file  . Attends Religious Services: Not on file   . Active Member of Clubs or Organizations: Not on file  . Attends Archivist Meetings: Not on file  . Marital Status: Not on file  Intimate Partner Violence:   . Fear of Current or Ex-Partner: Not on file  . Emotionally Abused: Not on file  . Physically Abused: Not on file  . Sexually Abused: Not on file    Social History   Tobacco Use  Smoking Status Never Smoker  Smokeless Tobacco Never Used    Social History   Substance and Sexual Activity  Alcohol Use No     Allergies  Allergen Reactions  . Quinolones     Current Outpatient Medications  Medication Sig Dispense Refill  . aspirin EC 81 MG tablet Take 81 mg by mouth daily.      Marland Kitchen atorvastatin (LIPITOR) 40 MG tablet Take 1 tablet by mouth daily.  3  . CINNAMON PO Take 1,000 mg by mouth daily.     . hydrochlorothiazide (HYDRODIURIL) 12.5 MG tablet Take 12.5 mg by mouth daily.      Marland Kitchen lisinopril (PRINIVIL,ZESTRIL) 10 MG tablet Take 10 mg by mouth daily.    . metFORMIN (GLUCOPHAGE) 500 MG tablet Take 1 tablet (500 mg total) by mouth daily with breakfast.    . metoprolol succinate (TOPROL-XL) 25 MG 24 hr tablet Take 0.5 tablets (12.5 mg total) by mouth daily. 45 tablet 3  . naproxen sodium (ANAPROX) 220 MG tablet Take 220 mg by mouth every 8 (eight) hours as needed. For pain    . potassium chloride (MICRO-K) 10 MEQ CR capsule Take 10 mEq by mouth daily as needed.    . vitamin B-12 (CYANOCOBALAMIN) 1000 MCG tablet Take 1,000 mcg by mouth daily.      . pantoprazole (PROTONIX) 40 MG tablet Take 1 tablet (40 mg total) by mouth daily. 30 tablet 1   No current facility-administered medications for this visit.       Review of Systems:     Cardiac Review of Systems: Y or N  Chest Pain [ n ]  Resting SOB [n   Exertional SOB  [n]  Orthopnea [ n ]   Pedal Edema [n ]    Palpitations [n] Syncope  [n ]   Presyncope [n  ]  General Review of Systems: [Y] = yes [  ]=no Constitional: recent weight change [  ]; anorexia [  ];  fatigue [ n ]; nausea [  ]; night sweats [  ]; fever [  ]; or chills [  ];  Dental: poor dentition[  ]; Last Dentist visit: twice a year  Eye : blurred vision [  ]; diplopia [   ]; vision changes [  ];  Amaurosis fugax[  ]; Resp: cough [  ];  wheezing[  ];  hemoptysis[  ]; shortness of breath[  ]; paroxysmal nocturnal dyspnea[  ]; dyspnea on exertion[  ]; or orthopnea[  ];  GI:  gallstones[  ], vomiting[  ];  dysphagia[  ]; melena[  ];  hematochezia [  ]; heartburn[  ];   Hx of  Colonoscopy[ y ]; GU: kidney stones [  ]; hematuria[  ];   dysuria [  ];  nocturia[  ];  history of     obstruction [  ]; urinary frequency [  ]             Skin: rash, swelling[  ];, hair loss[  ];  peripheral edema[  ];  or itching[  ]; Musculosketetal: myalgias[  ];  joint swelling[  ];  joint erythema[  ];  joint pain[  ];  back pain[  ];  Heme/Lymph: bruising[  ];  bleeding[  ];  anemia[  ];  Neuro: TIA[  ];  headaches[  ];  stroke[  ];  vertigo[  ];  seizures[  ];   paresthesias[  ];  difficulty walking[  ];  Psych:depression[  ]; anxiety[  ];  Endocrine: diabetes[  ];  thyroid dysfunction[  ];  Immunizations: Flu Jazmín.Cullens ]; Pneumococcal[N ];  Other:  Physical Exam: BP (!) 167/89 (BP Location: Left Arm, Patient Position: Sitting, Cuff Size: Normal)   Pulse 65   Temp 97.9 F (36.6 C) (Oral)   Resp 18   Ht 5\' 11"  (1.803 m)   Wt 190 lb 6.4 oz (86.4 kg)   SpO2 97% Comment: RA  BMI 26.56 kg/m   General appearance: alert, cooperative, appears stated age and no distress Head: Normocephalic, without obvious abnormality, atraumatic Neck: no adenopathy, no carotid bruit, no JVD, supple, symmetrical, trachea midline and thyroid not enlarged, symmetric, no tenderness/mass/nodules Lymph nodes: Cervical, supraclavicular, and axillary nodes normal. Resp: clear to auscultation bilaterally  Back: symmetric, no curvature. ROM normal. No CVA tenderness. Cardio: regular rate and rhythm, S1, S2 normal, no murmur, click, rub or gallop GI: soft, non-tender; bowel sounds normal; no masses,  no organomegaly Extremities: extremities normal, atraumatic, no cyanosis or edema and Homans sign is negative, no sign of DVT Neurologic: Grossly normal   Diagnostic Studies & Laboratory data:     Recent Radiology Findings:   CT ANGIO CHEST AORTA W/CM & OR WO/CM  Result Date: 12/15/2019 CLINICAL DATA:  Thoracic aortic aneurysm, follow-up EXAM: CT ANGIOGRAPHY CHEST WITH CONTRAST TECHNIQUE: Multidetector CT imaging of the chest was performed using the standard protocol during bolus administration of intravenous contrast. Multiplanar CT image reconstructions and MIPs were obtained to evaluate the vascular anatomy. CONTRAST:  76mL ISOVUE-370 IOPAMIDOL (ISOVUE-370) INJECTION 76% COMPARISON:  06/10/2018 and previous FINDINGS: Cardiovascular: Heart size normal. No pericardial effusion. Fair contrast opacification of the pulmonary arterial tree; the exam was not optimized for detection of pulmonary emboli. Scattered coronary calcifications. Good contrast opacification of the thoracic aorta. No dissection or stenosis. Aortic Root: --Valve: 2.8 cm --Sinuses: 4.6 cm --Sinotubular Junction: 3.8 cm Limitations by motion: Moderate Thoracic Aorta: --Ascending Aorta: 4.3 cm (stable by my measurement) --Aortic Arch: 3.6 cm --Descending Aorta: 3.6 Minimal scattered calcified atheromatous plaque in the descending segment and visualized proximal abdominal aorta. Mediastinum/Nodes: No mass or adenopathy. Lungs/Pleura:  No pleural effusion. No pneumothorax. Lungs are clear. Upper Abdomen: No acute findings. Previously described left renal cystic lesion incompletely visualized. Musculoskeletal: Spondylitic changes in the lower cervical spine. No fracture or worrisome bone lesion. Review of the MIP images confirms the above findings.  IMPRESSION: 1. Stable 4.3 cm ascending thoracic aortic aneurysm without complicating features. Recommend annual imaging followup by CTA or MRA. This recommendation follows 2010 ACCF/AHA/AATS/ACR/ASA/SCA/SCAI/SIR/STS/SVM Guidelines for the Diagnosis and Management of Patients with Thoracic Aortic Disease. Circulation. 2010; 121: J194-R740 2. Coronary and Aortic Atherosclerosis (ICD10-I70.0). Electronically Signed   By: Lucrezia Europe M.D.   On: 12/15/2019 14:23    I have independently reviewed the above radiology studies  and reviewed the findings with the patient. On review of the scan done in the emergency room April 2020, the scan and several of his older scans there is incomplete imaging of a left renal mass-we will refer to urology to review the scans and make recommendations for further imaging and/or treatment.    RADIOLOGY REPORT*  Clinical Data: Evaluate ascending aortic aneurysm  CT ANGIOGRAPHY CHEST  Technique: Multidetector CT imaging of the chest using the  standard protocol during bolus administration of intravenous  contrast. Multiplanar reconstructed images including MIPs were  obtained and reviewed to evaluate the vascular anatomy.  Contrast: 116mL OMNIPAQUE IOHEXOL 350 MG/ML SOLN  Comparison: Chest CT - 03/12/2010; 03/24/2008  Vascular Findings:  There is grossly unchanged fusiform ectasia of the ascending  thoracic aorta with measurements as follows. The aorta tapers to a  normal caliber at the level of the aortic arch. 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 Normal caliber of the  descending thoracic aorta. No evidence of thoracic aortic  dissection. No periaortic stranding. Minimal calcifications within  the aortic valve annulus.  Sinotubular junction: 36 mm as measured in greatest oblique coronal  dimension, unchanged.  Proximal ascending aorta: 42 x 44 mm as measured in greatest  oblique axial dimension at the level of the main pulmonary artery.  The  ascending thoracic aorta measures approximately 44 mm in  greatest oblique coronal dimension. These measurements are grossly  unchanged since the 03/2008 examination  Aortic arch aorta: 26 mm as measured in greatest oblique sagittal  dimension.  Proximal descending thoracic aorta: 32 x 30 mm as measured in  greatest oblique axial dimension at the level of the main pulmonary  artery, unchanged.  Distal descending thoracic aorta: 26 x 25 mm as measured in  greatest oblique axial dimension at the level of the diaphragmatic  hiatus, unchanged.  Borderline cardiomegaly. Minimal coronary artery calcifications.  No pericardial effusion. Although this examination was not  tailored for evaluation of the pulmonary arteries, there are no  discrete filling defects within the central pulmonary arterial tree  to suggest pulmonary embolism. Normal caliber of the main  pulmonary artery.  ----------------------------------------------  Nonvascular findings:  Minimal dependent subpleural ground-glass atelectasis. No focal  airspace opacities. No pleural effusion or pneumothorax. The  central pulmonary airways are patent. The punctate granuloma in  the right middle lobe (image 43, series six). No mediastinal,  hilar or axillary lymphadenopathy.  Early arterial phase evaluation of the upper abdomen is  unremarkable.  No acute or aggressive osseous abnormalities.  IMPRESSION:  Unchanged fusiform ectasia of the ascending thoracic aorta  measuring approximately 44 mm in greatest oblique axial dimension,  grossly unchanged since the 03/2008 examination.  Original Report Authenticated By: Jake Seats, MD   Recent Lab Findings: Lab Results  Component Value Date   WBC 7.1 06/09/2018   HGB 13.6 06/09/2018   HCT 40.9 06/09/2018   PLT 211 06/09/2018   GLUCOSE 165 (H) 06/09/2018   NA 138 06/09/2018   K 3.4 (L) 06/09/2018   CL 102 06/09/2018   CREATININE 1.00 06/09/2018   BUN 14 06/09/2018   CO2 27  06/09/2018   HGBA1C 7.9 (H) 06/10/2018   Aortic Size Index=       4.3  /Body surface area is 2.08 meters squared. =2.13  < 2.75 cm/m2      4% risk per year 2.75 to 4.25          8% risk per year > 4.25 cm/m2    20% risk per year     Assessment / Plan:   Patient is asymptomatic with stable appearing dilatation of the ascending aorta less than 4.3cm stable in size, in the setting of a trileaflet aortic valve and no family history of aortic dissection or a sending aortic aneurysms.  We will plan to see the patient back in 1 year with a CTA of the chest  Patient has follow-up appointment with cardiology in November of this year-we will discuss with them follow-up echocardiogram, since the last echogram cardiogram was done 10 years ago-reevaluate for degree of aortic insufficiency   On review of the patient's older scans there is a exophytic mass in the left kidney, incidentally  Seen on CTs of the chest-some of the chest CTs is not seen at all, scan today in the 1 done in the emergency room April 2020 shows presence.  Although not in the report it can be seen on CT scan in 2016  We will refer the patient to urology for further imaging and evaluation of left renal mass.  According to the 2010 ACC/AHA guidelines, we recommend patients with thoracic aortic disease to maintain a LDL of less than 70 and a HDL of greater than 50. We recommend their blood pressure to remain less than 135/85.    Grace Isaac MD  Beeper [phone] Office [phone] 12/15/2019 2:40 PM

## 2020-02-01 ENCOUNTER — Other Ambulatory Visit: Payer: Self-pay | Admitting: Cardiovascular Disease

## 2020-03-19 ENCOUNTER — Other Ambulatory Visit: Payer: Self-pay | Admitting: Cardiovascular Disease

## 2020-03-21 ENCOUNTER — Encounter: Payer: Self-pay | Admitting: Cardiovascular Disease

## 2020-03-21 ENCOUNTER — Ambulatory Visit: Payer: Medicare Other | Admitting: Cardiovascular Disease

## 2020-03-21 ENCOUNTER — Other Ambulatory Visit: Payer: Self-pay

## 2020-03-21 VITALS — BP 148/90 | HR 74 | Ht 71.0 in | Wt 189.0 lb

## 2020-03-21 DIAGNOSIS — I712 Thoracic aortic aneurysm, without rupture: Secondary | ICD-10-CM

## 2020-03-21 DIAGNOSIS — E119 Type 2 diabetes mellitus without complications: Secondary | ICD-10-CM

## 2020-03-21 DIAGNOSIS — I10 Essential (primary) hypertension: Secondary | ICD-10-CM

## 2020-03-21 DIAGNOSIS — E7800 Pure hypercholesterolemia, unspecified: Secondary | ICD-10-CM

## 2020-03-21 DIAGNOSIS — I7121 Aneurysm of the ascending aorta, without rupture: Secondary | ICD-10-CM

## 2020-03-21 MED ORDER — LISINOPRIL 20 MG PO TABS: 20.0000 mg | ORAL_TABLET | Freq: Every day | ORAL | 3 refills | Status: DC

## 2020-03-21 MED ORDER — METOPROLOL SUCCINATE ER 25 MG PO TB24: 25.0000 mg | ORAL_TABLET | Freq: Every day | ORAL | 3 refills | Status: DC

## 2020-03-21 NOTE — Patient Instructions (Addendum)
Medication Instructions:  INCREASE the Metoprolol Succinate to 25 mg once daily INCREASE the Lisinopril to 20 mg once daily.  *If you need a refill on your cardiac medications before your next appointment, please call your pharmacy*   Lab Work: None ordered If you have labs (blood work) drawn today and your tests are completely normal, you will receive your results only by: Marland Kitchen MyChart Message (if you have MyChart) OR . A paper copy in the mail If you have any lab test that is abnormal or we need to change your treatment, we will call you to review the results.   Testing/Procedures: None ordered   Follow-Up: At [facility], you and your health needs are our priority.  As part of our continuing mission to provide you with exceptional heart care, we have created designated Provider Care Teams.  These Care Teams include your primary Cardiologist (physician) and Advanced Practice Providers (APPs -  Physician Assistants and Nurse Practitioners) who all work together to provide you with the care you need, when you need it.  We recommend signing up for the patient portal called "MyChart".  Sign up information is provided on this After Visit Summary.  MyChart is used to connect with patients for Virtual Visits (Telemedicine).  Patients are able to view lab/test results, encounter notes, upcoming appointments, etc.  Non-urgent messages can be sent to your provider as well.   To learn more about what you can do with MyChart, go to [email or web address].    Your next appointment:   12 month(s)  The format for your next appointment:   In Person  Provider:   You may see [name], MD or one of the following Advanced Practice Providers on your designated Care Team:    Almyra Deforest, PA-C  Fabian Sharp, [place] or   Roby Lofts, [place]  Dr. Sallyanne Kuster would like you to check your blood pressure daily for the next 2 weeks.  Keep a journal of these daily blood pressure and heart rate readings  and call our office or send a message through [facility] with the results. Thank you!  It is best to check your BP 1-2 hours after taking your medications to see the medications effectiveness on your BP.    Here are some tips that our clinical pharmacists share for home BP monitoring:          Rest 5 minutes before taking your blood pressure.          Don't smoke or drink caffeinated beverages for at least 30 minutes before.          Take your blood pressure before (not after) you eat.          Sit comfortably with your back supported and both feet on the floor (don't cross your legs).          Elevate your arm to heart level on a table or a desk.          Use the proper sized cuff. It should fit smoothly and snugly around your bare upper arm. There should be enough room to slip a fingertip under the cuff. The bottom edge of the cuff should be 1 inch above the crease of the elbow.

## 2020-03-21 NOTE — Progress Notes (Signed)
Cardiology Office Note:    Date:  03/26/2020   ID:  Brian Gray, DOB Aug 11, 1945, MRN [id number]  PCP:  [name], MD  Cardiologist:  [name], MD    Referring MD: [name], MD   chief complaint: F/u ascending aortic aneurysm, hypertension, hyperlipidemia  History of Present Illness:    Brian Gray is a 75 y.o. male with a hx of mild dilation of the ascending aorta (4.4 cm, stable since 2010, most recent CTA October 2021), history of false positive nuclear stress test leading to a coronary angiogram in 2012 that showed no significant stenoses, treated hyperlipidemia, hypertension and borderline diabetes mellitus, returning for follow-up.  He feels well and has no cardiovascular complaints.The patient specifically denies any chest pain at rest exertion, dyspnea at rest or with exertion, orthopnea, paroxysmal nocturnal dyspnea, syncope, palpitations, focal neurological deficits, intermittent claudication, lower extremity edema, unexplained weight gain, cough, hemoptysis or wheezing.  His blood pressure has been rather high, consistently around 145/90, similar in the office today.  He is taking relatively low doses of a beta-blocker, ACE inhibitor and hydrochlorothiazide.  He has not had any dizziness or syncope.  CT angiogram shows stable diameter of the ascending aorta.  Most recent hemoglobin A1c remains suboptimal.  I do not have his recent lipid profile, but reports that Dr. Karie Gray was satisfied with the results when last performed.    Past Medical History:  Diagnosis Date  . Hyperlipidemia 06/10/2018  . Hypertension 06/10/2018    Past Surgical History:  Procedure Laterality Date  . CARDIAC CATHETERIZATION    . CARDIAC CATHETERIZATION  02/21/2011   Normal Coronary Arteries  . COLONOSCOPY  05/15/2011   Procedure: COLONOSCOPY;  Surgeon: [name], MD;  [facility];  Service: Endoscopy;  Laterality: N/A;  930  . DOPPLER ECHOCARDIOGRAPHY   03/20/2008   EF>55%  . LEFT HEART CATHETERIZATION WITH CORONARY ANGIOGRAM N/A 02/21/2011   Procedure: LEFT HEART CATHETERIZATION WITH CORONARY ANGIOGRAM;  Surgeon: [name], MD;  Location: [facility];  Service: Cardiovascular;  Laterality: N/A;  . NM MYOVIEW LTD  02/04/2011   EF 58%    Current Medications: Current Meds  Medication Sig  . aspirin EC 81 MG tablet Take 81 mg by mouth daily.  Marland Kitchen atorvastatin (LIPITOR) 40 MG tablet Take 1 tablet by mouth daily.  Marland Kitchen CINNAMON PO Take 1,000 mg by mouth daily.  . hydrochlorothiazide (HYDRODIURIL) 12.5 MG tablet Take 12.5 mg by mouth daily.  . metFORMIN (GLUCOPHAGE) 500 MG tablet Take 1 tablet (500 mg total) by mouth daily with breakfast.  . naproxen sodium (ANAPROX) 220 MG tablet Take 220 mg by mouth every 8 (eight) hours as needed. For pain  . potassium chloride (MICRO-K) 10 MEQ CR capsule Take 10 mEq by mouth daily as needed.  . vitamin B-12 (CYANOCOBALAMIN) 1000 MCG tablet Take 1,000 mcg by mouth daily.  . [DISCONTINUED] lisinopril (PRINIVIL,ZESTRIL) 10 MG tablet Take 10 mg by mouth daily.  . [DISCONTINUED] metoprolol succinate (TOPROL-XL) 25 MG 24 hr tablet TAKE ONE-HALF TABLET BY  MOUTH DAILY     Allergies:   Quinolones   Social History   Socioeconomic History  . Marital status: Married    Spouse name: Not on file  . Number of children: Not on file  . Years of education: Not on file  . Highest education level: Not on file  Occupational History  . Not on file  Tobacco Use  . Smoking status: Never Smoker  . Smokeless  tobacco: Never Used  Substance and Sexual Activity  . Alcohol use: No  . Drug use: No  . Sexual activity: Yes    Partners: Female    Birth control/protection: None  Other Topics Concern  . Not on file  Social History Narrative  . Not on file   Social Determinants of Health   Financial Resource Strain: Not on file  Food Insecurity: Not on file  Transportation Needs: Not on file  Physical Activity: Not  on file  Stress: Not on file  Social Connections: Not on file     Family History: The patient's family history is negative for Colon cancer.,  Aortic aneurysm/dissection or sudden death ROS:   Please see the history of present illness.    All other systems are reviewed and are negative.   EKGs/Labs/Other Studies Reviewed:    The following studies were reviewed today: Hospitalization records and CT angiography from October 2021 Notes from Dr. [name] October 2021  EKG:  EKG is ordered today and it is very similar to previous tracing showing normal sinus rhythm, chronic right bundle branch block, no ischemic repolarization abnormalities  Recent Labs: Hemoglobin A1c 7.9% Recent Lipid Panel Not available  Physical Exam:    VS:  BP (!) 148/90 (BP Location: Left Arm, Patient Position: Sitting, Cuff Size: Normal)   Pulse 74   Ht 5\' 11"  (1.803 m)   Wt 189 lb (85.7 kg)   BMI 26.36 kg/m    Recheck BP 10 minutes later 145/92  Wt Readings from Last 3 Encounters:  03/21/20 189 lb (85.7 kg)  12/15/19 190 lb 6.4 oz (86.4 kg)  01/18/19 193 lb 3.2 oz (87.6 kg)     General: Alert, oriented x3, no distress, minimally overweight Head: no evidence of trauma, PERRL, EOMI, no exophtalmos or lid lag, no myxedema, no xanthelasma; normal ears, nose and oropharynx Neck: normal jugular venous pulsations and no hepatojugular reflux; brisk carotid pulses without delay and no carotid bruits Chest: clear to auscultation, no signs of consolidation by percussion or palpation, normal fremitus, symmetrical and full respiratory excursions Cardiovascular: normal position and quality of the apical impulse, regular rhythm, normal first and second heart sounds, no murmurs, rubs or gallops Abdomen: no tenderness or distention, no masses by palpation, no abnormal pulsatility or arterial bruits, normal bowel sounds, no hepatosplenomegaly Extremities: no clubbing, cyanosis or edema; 2+ radial, ulnar and brachial  pulses bilaterally; 2+ right femoral, posterior tibial and dorsalis pedis pulses; 2+ left femoral, posterior tibial and dorsalis pedis pulses; no subclavian or femoral bruits Neurological: grossly nonfocal Psych: Normal mood and affect     ASSESSMENT:    1. Ascending aortic aneurysm ([place])   2. Essential hypertension   3. Hypercholesterolemia   4. Diabetes mellitus type 2 in nonobese [facility])    PLAN:    In order of problems listed above:  1. AATA: Stable in size since 2010 and asymptomatic.  CT angiogram and follow-up with Dr. [name] was last on October 2021. Aortic Size Index =2.13.  Plan to recheck CT angiogram in 1 year. 2. HTN: Need to increase his antihypertensive medications.  We will increase the lisinopril (to 20 mg daily) and metoprolol (to 25 mg daily).  Keep BP consistently 130/80 or less. 3. HLP: Need to get his lipid profile results, which are followed by Dr. Karie Gray 4. DM: Target hemoglobin A1c less than 7%.   Medication Adjustments/Labs and Tests Ordered: Current medicines are reviewed at length with the patient today.  Concerns regarding medicines  are outlined above.  Orders Placed This Encounter  Procedures  . EKG 12-Lead   Meds ordered this encounter  Medications  . metoprolol succinate (TOPROL-XL) 25 MG 24 hr tablet    Sig: Take 1 tablet (25 mg total) by mouth daily.    Dispense:  90 tablet    Refill:  3    Requesting 1 year supply  . lisinopril (ZESTRIL) 20 MG tablet    Sig: Take 1 tablet (20 mg total) by mouth daily.    Dispense:  90 tablet    Refill:  3    Signed, [name], MD  03/26/2020 4:26 PM    [place]

## 2020-03-21 NOTE — Progress Notes (Signed)
EKG

## 2020-11-16 ENCOUNTER — Other Ambulatory Visit: Payer: Self-pay | Admitting: Surgery

## 2020-11-16 DIAGNOSIS — I712 Thoracic aortic aneurysm, without rupture, unspecified: Secondary | ICD-10-CM

## 2020-12-20 ENCOUNTER — Other Ambulatory Visit: Payer: Self-pay | Admitting: *Deleted

## 2020-12-20 ENCOUNTER — Other Ambulatory Visit: Payer: Self-pay

## 2020-12-20 ENCOUNTER — Ambulatory Visit
Admission: RE | Admit: 2020-12-20 | Discharge: 2020-12-20 | Disposition: A | Discharge status: Home / Self Care | Payer: Medicare Other | Source: Ambulatory Visit | Attending: Surgery | Admitting: Surgery

## 2020-12-20 ENCOUNTER — Ambulatory Visit: Payer: Medicare Other | Admitting: Physician Assistant

## 2020-12-20 ENCOUNTER — Encounter: Payer: Self-pay | Admitting: Physician Assistant

## 2020-12-20 VITALS — BP 153/93 | HR 62 | Resp 20 | Ht 71.0 in | Wt 184.0 lb

## 2020-12-20 DIAGNOSIS — I77810 Thoracic aortic ectasia: Secondary | ICD-10-CM

## 2020-12-20 DIAGNOSIS — I351 Nonrheumatic aortic (valve) insufficiency: Secondary | ICD-10-CM

## 2020-12-20 DIAGNOSIS — I712 Thoracic aortic aneurysm, without rupture, unspecified: Secondary | ICD-10-CM

## 2020-12-20 MED ORDER — IOPAMIDOL (ISOVUE-370) INJECTION 76%
75.0000 mL | Freq: Once | INTRAVENOUS | Status: AC | PRN
  Administered 2020-12-20: 75 mL via INTRAVENOUS

## 2020-12-20 NOTE — Progress Notes (Signed)
[name] #[id number] Date of Birth: 24-Jul-1945  Referring: [name], MD Primary Care: [name], MD  Chief Complaint:    Chief Complaint  Patient presents with   Thoracic Aortic Aneurysm    yearly f/u with CTA chest    History of Present Illness:    Patient is a 75 year old male followed in the office for mildly dilated ascending aorta.  He was originally seen in February 2011 by Dr. [name] for a dilated acending aorta at that time there was approximately 4.4 cm in size and had been known about for 4-5 years. He has had  a cardiac catheterization 02/21/11 that showed no significant stenosis. Last  Echocardiogram in 2010 noted trileaflet aortic valve.  Overall the patient feels well denies any of significant shortness of breath with exertion or chest discomfort.  He was seen in the emergency room in April 2020 for chest pain and a follow-up CTA of the chest was done at that time without evidence of dissection or change in size of his aorta.  He has had no recurrent chest pain after that that time. He has no family history of abnormal aorta aortic aneurysms or sudden death from unknown cause.  He presents to the office for his 1 year follow-up for aortic aneurysm surveillance and CTA.  He has had no new medical issues occur in the last year.  He continues to monitor his blood pressure closely at home.  He did have 1 episode where he was lifting heavy furniture and he had some pain afterwards.  I encouraged him not to lift any thing heavy from now on.  Current Activity/ Functional Status:  Patient is independent with mobility/ambulation, transfers, ADL's, IADL's.  Zubrod Score: At the time of surgery this patient's most appropriate activity status/level should be described as: [x]  Normal activity, no symptoms []  Symptoms, fully ambulatory []  Symptoms, in bed less than or equal to 50% of the time []  Symptoms, in bed  greater than 50% of the time but less than 100% []  Bedridden []  Moribund   Past Medical History:  Diagnosis Date   Hyperlipidemia 06/10/2018   Hypertension 06/10/2018    Past Surgical History:  Procedure Laterality Date   CARDIAC CATHETERIZATION     CARDIAC CATHETERIZATION  02/21/2011   Normal Coronary Arteries   COLONOSCOPY  05/15/2011   Procedure: COLONOSCOPY;  Surgeon: [name], MD;  [facility];  Service: Endoscopy;  Laterality: N/A;  930   DOPPLER ECHOCARDIOGRAPHY  03/20/2008   EF>55%   LEFT HEART CATHETERIZATION WITH CORONARY ANGIOGRAM N/A 02/21/2011   Procedure: LEFT HEART CATHETERIZATION WITH CORONARY ANGIOGRAM;  Surgeon: [name], MD;  Location: [facility] CATH [facility];  Service: Cardiovascular;  Laterality: N/A;   NM MYOVIEW LTD  02/04/2011   EF 58%    Family History  Problem Relation Age of Onset   Colon cancer Neg Hx   father died ruptured stomach at age 46 ulcer , three brothers have has heart surgery no history dissection  Social History   Socioeconomic History   Marital status: Married    Spouse name: Not on file   Number of children: Not on file   Years of education: Not on file   Highest education level: Not on file  Occupational History   Not on file  Tobacco Use   Smoking status: Never Smoker  Smokeless tobacco: Never Used  Substance and Sexual Activity   Alcohol use: No   Drug use: No   Sexual activity: Yes    Partners: Female    Birth control/protection: None  Other Topics Concern   Not on file  Social History Narrative   Not on file   Social Determinants of Health   Financial Resource Strain:    Difficulty of Paying Living Expenses: Not on file  Food Insecurity:    Worried About Charity fundraiser in the Last Year: Not on file   [employer] of Food in the Last Year: Not on file  Transportation Needs:    Lack of Transportation (Medical): Not on file   Lack of Transportation (Non-Medical): Not on file  Physical Activity:     Days of Exercise per Week: Not on file   Minutes of Exercise per Session: Not on file  Stress:    Feeling of Stress : Not on file  Social Connections:    Frequency of Communication with Friends and Family: Not on file   Frequency of Social Gatherings with Friends and Family: Not on file   Attends Religious Services: Not on file   Active Member of Clubs or Organizations: Not on file   Attends Archivist Meetings: Not on file   Marital Status: Not on file  Intimate Partner Violence:    Fear of Current or Ex-Partner: Not on file   Emotionally Abused: Not on file   Physically Abused: Not on file   Sexually Abused: Not on file    Social History   Tobacco Use  Smoking Status Never Smoker  Smokeless Tobacco Never Used    Social History   Substance and Sexual Activity  Alcohol Use No     Allergies  Allergen Reactions   Quinolones    Current Outpatient Medications on File Prior to Visit  Medication Sig Dispense Refill   aspirin EC 81 MG tablet Take 81 mg by mouth daily.     atorvastatin (LIPITOR) 40 MG tablet Take 1 tablet by mouth daily.  3   CINNAMON PO Take 1,000 mg by mouth daily.     hydrochlorothiazide (HYDRODIURIL) 12.5 MG tablet Take 12.5 mg by mouth daily.     lisinopril (ZESTRIL) 20 MG tablet Take 1 tablet (20 mg total) by mouth daily. 90 tablet 3   metFORMIN (GLUCOPHAGE) 500 MG tablet Take 1 tablet (500 mg total) by mouth daily with breakfast.     metoprolol succinate (TOPROL-XL) 25 MG 24 hr tablet Take 1 tablet (25 mg total) by mouth daily. 90 tablet 3   naproxen sodium (ANAPROX) 220 MG tablet Take 220 mg by mouth every 8 (eight) hours as needed. For pain     potassium chloride (MICRO-K) 10 MEQ CR capsule Take 10 mEq by mouth daily as needed.     vitamin B-12 (CYANOCOBALAMIN) 1000 MCG tablet Take 1,000 mcg by mouth daily.     pantoprazole (PROTONIX) 40 MG tablet Take 1 tablet (40 mg total) by mouth daily. 30 tablet 1   No current facility-administered  medications on file prior to visit.       Physical Exam: Vitals:   12/20/20 1102 12/20/20 1115  BP: (!) 174/88 (!) 153/93  Pulse: 62   Resp: 20   SpO2: 97%    General: Well-developed, well-appearing male patient. Neck: No JVD, no carotid bruit Cor: RRR, no murmur Pulm: CTA bilaterally and in all fields Abdomen: No tenderness, positive bowel sounds Extremity: No  edema  Diagnostic Studies & Laboratory data:     Recent Radiology Findings:   CLINICAL DATA:  Follow-up thoracic aortic aneurysm   EXAM: CT ANGIOGRAPHY CHEST WITH CONTRAST   TECHNIQUE: Multidetector CT imaging of the chest was performed using the standard protocol during bolus administration of intravenous contrast. Multiplanar CT image reconstructions and MIPs were obtained to evaluate the vascular anatomy.   CONTRAST:  29mL ISOVUE-370 IOPAMIDOL (ISOVUE-370) INJECTION 76%   COMPARISON:  12/15/2019   FINDINGS: Cardiovascular: Heart size is within normal limits. No pericardial effusion. Central pulmonary vasculature is nondilated. No central pulmonary arterial filling defects. Thoracic aorta is well opacified. No aortic dissection. Mid ascending thoracic aorta measures 4.3 cm in diameter, unchanged. Common origin of the brachiocephalic and left common carotid arteries. Scattered atherosclerotic calcification of the aorta and coronary arteries.   Mediastinum/Nodes: No enlarged mediastinal, hilar, or axillary lymph nodes. Thyroid gland, trachea, and esophagus demonstrate no significant findings.   Lungs/Pleura: Lungs are clear. No pleural effusion or pneumothorax.   Upper Abdomen: Partially visualized bilobed left renal cyst. No new or acute findings within the included upper abdomen.   Musculoskeletal: No chest wall abnormality. No acute or significant osseous findings.   Review of the MIP images confirms the above findings.   IMPRESSION: 1. Stable ascending thoracic aortic aneurysm measuring up to  4.3 cm in diameter. Recommend annual imaging followup by CTA or MRA. This recommendation follows 2010 ACCF/AHA/AATS/ACR/ASA/SCA/SCAI/SIR/STS/SVM Guidelines for the Diagnosis and Management of Patients with Thoracic Aortic Disease. Circulation. 2010; 121: T517-O160. Aortic aneurysm NOS (ICD10-I71.9) 2. No acute findings in the chest. 3. 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 Please see CT abdomen report from 05/14/2020 for follow-up recommendations.     Electronically Signed   By: Davina Poke D.O.   On: 12/20/2020 10:55  CT ANGIO CHEST AORTA W/CM & OR WO/CM  Result Date: 12/15/2019 CLINICAL DATA:  Thoracic aortic aneurysm, follow-up EXAM: CT ANGIOGRAPHY CHEST WITH CONTRAST TECHNIQUE: Multidetector CT imaging of the chest was performed using the standard protocol during bolus administration of intravenous contrast. Multiplanar CT image reconstructions and MIPs were obtained to evaluate the vascular anatomy. CONTRAST:  52mL ISOVUE-370 IOPAMIDOL (ISOVUE-370) INJECTION 76% COMPARISON:  06/10/2018 and previous FINDINGS: Cardiovascular: Heart size normal. No pericardial effusion. Fair contrast opacification of the pulmonary arterial tree; the exam was not optimized for detection of pulmonary emboli. Scattered coronary calcifications. Good contrast opacification of the thoracic aorta. No dissection or stenosis. Aortic Root: --Valve: 2.8 cm --Sinuses: 4.6 cm --Sinotubular Junction: 3.8 cm Limitations by motion: Moderate Thoracic Aorta: --Ascending Aorta: 4.3 cm (stable by my measurement) --Aortic Arch: 3.6 cm --Descending Aorta: 3.6 Minimal scattered calcified atheromatous plaque in the descending segment and visualized proximal abdominal aorta. Mediastinum/Nodes: No mass or adenopathy. Lungs/Pleura: No pleural effusion. No pneumothorax. Lungs are clear. Upper Abdomen: No acute findings. Previously described left renal cystic lesion incompletely  visualized. Musculoskeletal: Spondylitic changes in the lower cervical spine. No fracture or worrisome bone lesion. Review of the MIP images confirms the above findings. IMPRESSION: 1. Stable 4.3 cm ascending thoracic aortic aneurysm without complicating features. Recommend annual imaging followup by CTA or MRA. This recommendation follows 2010 ACCF/AHA/AATS/ACR/ASA/SCA/SCAI/SIR/STS/SVM Guidelines for the Diagnosis and Management of Patients with Thoracic Aortic Disease. Circulation. 2010; 121: V371-G626 2. Coronary and Aortic Atherosclerosis (ICD10-I70.0). Electronically Signed   By: Lucrezia Europe M.D.   On: 12/15/2019 14:23    RADIOLOGY REPORT*  Clinical Data: Evaluate ascending aortic aneurysm  CT ANGIOGRAPHY  CHEST  Technique: Multidetector CT imaging of the chest using the  standard protocol during bolus administration of intravenous  contrast. Multiplanar reconstructed images including MIPs were  obtained and reviewed to evaluate the vascular anatomy.  Contrast: 13mL OMNIPAQUE IOHEXOL 350 MG/ML SOLN  Comparison: Chest CT - 03/12/2010; 03/24/2008  Vascular Findings:  There is grossly unchanged fusiform ectasia of the ascending  thoracic aorta with measurements as follows. The aorta tapers to a  normal caliber at the level of the aortic arch. 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 Normal caliber of the  descending thoracic aorta. No evidence of thoracic aortic  dissection. No periaortic stranding. Minimal calcifications within  the aortic valve annulus.  Sinotubular junction: 36 mm as measured in greatest oblique coronal  dimension, unchanged.  Proximal ascending aorta: 42 x 44 mm as measured in greatest  oblique axial dimension at the level of the main pulmonary artery.  The ascending thoracic aorta measures approximately 44 mm in  greatest oblique coronal dimension. These measurements are grossly  unchanged since the 03/2008 examination  Aortic arch aorta: 26 mm as  measured in greatest oblique sagittal  dimension.  Proximal descending thoracic aorta: 32 x 30 mm as measured in  greatest oblique axial dimension at the level of the main pulmonary  artery, unchanged.  Distal descending thoracic aorta: 26 x 25 mm as measured in  greatest oblique axial dimension at the level of the diaphragmatic  hiatus, unchanged.  Borderline cardiomegaly. Minimal coronary artery calcifications.  No pericardial effusion. Although this examination was not  tailored for evaluation of the pulmonary arteries, there are no  discrete filling defects within the central pulmonary arterial tree  to suggest pulmonary embolism. Normal caliber of the main  pulmonary artery.  ----------------------------------------------  Nonvascular findings:  Minimal dependent subpleural ground-glass atelectasis. No focal  airspace opacities. No pleural effusion or pneumothorax. The  central pulmonary airways are patent. The punctate granuloma in  the right middle lobe (image 43, series six). No mediastinal,  hilar or axillary lymphadenopathy.  Early arterial phase evaluation of the upper abdomen is  unremarkable.  No acute or aggressive osseous abnormalities.  IMPRESSION:  Unchanged fusiform ectasia of the ascending thoracic aorta  measuring approximately 44 mm in greatest oblique axial dimension,  grossly unchanged since the 03/2008 examination.  Original Report Authenticated By: Jake Seats, MD   Recent Lab Findings: Lab Results  Component Value Date   WBC 7.1 06/09/2018   HGB 13.6 06/09/2018   HCT 40.9 06/09/2018   PLT 211 06/09/2018   GLUCOSE 165 (H) 06/09/2018   NA 138 06/09/2018   K 3.4 (L) 06/09/2018   CL 102 06/09/2018   CREATININE 1.00 06/09/2018   BUN 14 06/09/2018   CO2 27 06/09/2018   HGBA1C 7.9 (H) 06/10/2018   Aortic Size Index=       4.3  /Body surface area is 2.08 meters squared. =2.13  < 2.75 cm/m2      4% risk per year 2.75 to 4.25          8% risk per  year > 4.25 cm/m2    20% risk per year     Assessment / Plan:    Patient is asymptomatic with stable appearing dilatation of the ascending aorta l 4.3cm stable in size, in the setting of a trileaflet aortic valve and no family history of aortic dissection or a sending aortic aneurysms.  We will plan to see the patient back in 1 year  with a CTA of the chest  Patient has follow-up appointment with cardiology in January of 2023-would recommend  follow-up echocardiogram, since the last echogram cardiogram was done 10 years ago-reevaluate for degree of aortic insufficiency  We will refer the patient to urology for further imaging and evaluation of left renal mass.  Hypertension- will refer to cardiology. He did say that at home it is usually 130s/80. I encouraged him to continue taking his BP at home.   According to the 2010 ACC/AHA guidelines, we recommend patients with thoracic aortic disease to maintain a LDL of less than 70 and a HDL of greater than 50. We recommend their blood pressure to remain less than 135/85.   Plan: Please follow-up in 1 year for a repeat CTA of the chest for aortic aneurysm surveillance. Please follow-up with your cardiologist for Echocardiogram.    Nicholes Rough, PA-C  Office [phone] 12/20/2020

## 2021-01-03 ENCOUNTER — Other Ambulatory Visit: Payer: Self-pay | Admitting: Cardiovascular Disease

## 2021-01-04 ENCOUNTER — Other Ambulatory Visit: Payer: Self-pay

## 2021-01-04 ENCOUNTER — Ambulatory Visit (HOSPITAL_COMMUNITY): Payer: Medicare Other | Attending: Cardiology

## 2021-01-04 DIAGNOSIS — I351 Nonrheumatic aortic (valve) insufficiency: Secondary | ICD-10-CM | POA: Diagnosis present

## 2021-01-04 LAB — ECHOCARDIOGRAM COMPLETE
Area-P 1/2: 2.39 cm2
P 1/2 time: 433 msec
S' Lateral: 3.2 cm

## 2021-03-09 ENCOUNTER — Other Ambulatory Visit: Payer: Self-pay | Admitting: Cardiovascular Disease

## 2021-03-26 ENCOUNTER — Other Ambulatory Visit: Payer: Self-pay

## 2021-03-26 ENCOUNTER — Ambulatory Visit: Payer: Medicare Other | Admitting: Cardiovascular Disease

## 2021-03-26 ENCOUNTER — Encounter: Payer: Self-pay | Admitting: Cardiovascular Disease

## 2021-03-26 VITALS — BP 162/80 | HR 73 | Ht 71.0 in | Wt 185.0 lb

## 2021-03-26 DIAGNOSIS — I10 Essential (primary) hypertension: Secondary | ICD-10-CM

## 2021-03-26 DIAGNOSIS — E119 Type 2 diabetes mellitus without complications: Secondary | ICD-10-CM | POA: Diagnosis not present

## 2021-03-26 DIAGNOSIS — I7121 Aneurysm of the ascending aorta, without rupture: Secondary | ICD-10-CM

## 2021-03-26 DIAGNOSIS — E7800 Pure hypercholesterolemia, unspecified: Secondary | ICD-10-CM

## 2021-03-26 MED ORDER — LISINOPRIL 40 MG PO TABS: 40.0000 mg | ORAL_TABLET | Freq: Every day | ORAL | 3 refills | Status: DC

## 2021-03-26 NOTE — Progress Notes (Signed)
Cardiology Office Note:    Date:  03/26/2021   ID:  REICHEN HUTZLER, DOB Jun 26, 1945, MRN [id number]  PCP:  [name], MD  Cardiologist:  [name], MD    Referring MD: [name], MD   chief complaint: F/u ascending aortic aneurysm, hypertension, hyperlipidemia  History of Present Illness:    Brian Gray is a 76 y.o. male with a hx of mild dilation of the ascending aorta (4.3-4.4 cm, stable since 2010, most recent CTA October 2022), history of false positive nuclear stress test leading to a coronary angiogram in 2012 that showed no significant stenoses, treated hyperlipidemia, hypertension and borderline diabetes mellitus, returning for follow-up.  The patient specifically denies any chest pain at rest exertion, dyspnea at rest or with exertion, orthopnea, paroxysmal nocturnal dyspnea, syncope, palpitations, focal neurological deficits, intermittent claudication, lower extremity edema, unexplained weight gain, cough, hemoptysis or wheezing.  His blood pressure has been trending higher and is especially concerned that his diastolic blood pressure is often in the mid to high 80s.  His blood pressure is definitely high today.  He does not have symptoms of orthostatic dizziness.  Denies edema.  Occasionally takes naproxen for back pain, but no more than once a month.  Repeat CT angiogram of the aorta showed unchanged diameter of the ascending aorta at 4.3 cm.    He is scheduled to have hemoglobin A1c and lipid profile with Dr. Karie Kirks later this week.   Past Medical History:  Diagnosis Date   Hyperlipidemia 06/10/2018   Hypertension 06/10/2018    Past Surgical History:  Procedure Laterality Date   CARDIAC CATHETERIZATION     CARDIAC CATHETERIZATION  02/21/2011   Normal Coronary Arteries   COLONOSCOPY  05/15/2011   Procedure: COLONOSCOPY;  Surgeon: [name], MD;  [facility];  Service: Endoscopy;  Laterality: N/A;  930   DOPPLER ECHOCARDIOGRAPHY  03/20/2008    EF>55%   LEFT HEART CATHETERIZATION WITH CORONARY ANGIOGRAM N/A 02/21/2011   Procedure: LEFT HEART CATHETERIZATION WITH CORONARY ANGIOGRAM;  Surgeon: [name], MD;  Location: [place]ATH [facility];  Service: Cardiovascular;  Laterality: N/A;   NM MYOVIEW LTD  02/04/2011   EF 58%    Current Medications: Current Meds  Medication Sig   aspirin EC 81 MG tablet Take 81 mg by mouth daily.   atorvastatin (LIPITOR) 40 MG tablet Take 1 tablet by mouth daily.   CINNAMON PO Take 1,000 mg by mouth daily.   hydrochlorothiazide (HYDRODIURIL) 12.5 MG tablet Take 12.5 mg by mouth daily.   metFORMIN (GLUCOPHAGE) 500 MG tablet Take 1 tablet (500 mg total) by mouth daily with breakfast.   metoprolol succinate (TOPROL-XL) 25 MG 24 hr tablet TAKE ONE-HALF TABLET BY  MOUTH DAILY   naproxen sodium (ANAPROX) 220 MG tablet Take 220 mg by mouth every 8 (eight) hours as needed. For pain   pantoprazole (PROTONIX) 40 MG tablet Take 1 tablet (40 mg total) by mouth daily.   potassium chloride (MICRO-K) 10 MEQ CR capsule Take 10 mEq by mouth daily as needed.   sildenafil (VIAGRA) 50 MG tablet Take 50 mg by mouth as needed.   vitamin B-12 (CYANOCOBALAMIN) 1000 MCG tablet Take 1,000 mcg by mouth daily.   [DISCONTINUED] lisinopril (ZESTRIL) 20 MG tablet TAKE 1 TABLET BY MOUTH EVERY DAY     Allergies:   Quinolones   Social History   Socioeconomic History   Marital status: Married    Spouse name: Not on file   Number of children:  Not on file   Years of education: Not on file   Highest education level: Not on file  Occupational History   Not on file  Tobacco Use   Smoking status: Never   Smokeless tobacco: Never  Substance and Sexual Activity   Alcohol use: No   Drug use: No   Sexual activity: Yes    Partners: Female    Birth control/protection: None  Other Topics Concern   Not on file  Social History Narrative   Not on file   Social Determinants of Health   Financial Resource Strain: Not on file  Food  Insecurity: Not on file  Transportation Needs: Not on file  Physical Activity: Not on file  Stress: Not on file  Social Connections: Not on file     Family History: The patient's family history is negative for Colon cancer.,  Aortic aneurysm/dissection or sudden death ROS:   Please see the history of present illness.    All other systems are reviewed and are negative.   EKGs/Labs/Other Studies Reviewed:    The following studies were reviewed today: Hospitalization records and CT angiography from October 2021 Notes from Dr. [name] October 2021  EKG:  EKG is ordered today and personally reviewed.  It is very similar to previous tracings show normal sinus rhythm and an old right bundle branch block, no ischemic repolarization abnormalities.  QTc 495 ms  Recent Labs: 01/24/2019 Creatinine 1.02, potassium 3.8, hemoglobin A1c 7.0%, hemoglobin 13.4  Recent Lipid Panel 01/24/2019 Cholesterol 116, HDL 34, LDL 64, triglycerides 94  Physical Exam:    VS:  BP (!) 162/80 (BP Location: Left Arm, Patient Position: Sitting, Cuff Size: Normal)    Pulse 73    Ht 5\' 11"  (1.803 m)    Wt 185 lb (83.9 kg)    SpO2 94%    BMI 25.80 kg/m    recheck blood pressure by me was 170/88.  Wt Readings from Last 3 Encounters:  03/26/21 185 lb (83.9 kg)  12/20/20 184 lb (83.5 kg)  03/21/20 189 lb (85.7 kg)   General: Alert, oriented x3, no distress, minimally overweight.  Appears younger than stated age. Head: no evidence of trauma, PERRL, EOMI, no exophtalmos or lid lag, no myxedema, no xanthelasma; normal ears, nose and oropharynx Neck: normal jugular venous pulsations and no hepatojugular reflux; brisk carotid pulses without delay and no carotid bruits Chest: clear to auscultation, no signs of consolidation by percussion or palpation, normal fremitus, symmetrical and full respiratory excursions Cardiovascular: normal position and quality of the apical impulse, regular rhythm, normal first and widely  split second heart sounds, no murmurs, rubs or gallops Abdomen: no tenderness or distention, no masses by palpation, no abnormal pulsatility or arterial bruits, normal bowel sounds, no hepatosplenomegaly Extremities: no clubbing, cyanosis or edema; 2+ radial, ulnar and brachial pulses bilaterally; 2+ right femoral, posterior tibial and dorsalis pedis pulses; 2+ left femoral, posterior tibial and dorsalis pedis pulses; no subclavian or femoral bruits Neurological: grossly nonfocal Psych: Normal mood and affect      ASSESSMENT:    1. Aneurysm of ascending aorta without rupture   2. Essential hypertension   3. Hypercholesterolemia   4. Diabetes mellitus type 2 in nonobese [facility])     PLAN:    In order of problems listed above:  AATA: Stable in size since 2010 and asymptomatic.  Now following with Dr. Cyndia Bent, most recent CT angiogram October 2022 with diameter of 4.3 cm.Marland Kitchen HTN: Increase lisinopril to 40 mg daily.  Send blood pressure log in a week.  If necessary we will also increase his metoprolol to 25 mg once daily.  Continue hydrochlorothiazide. HLP: Labs to be checked in a few days with PCP. DM: Target hemoglobin A1c less than 7%.   Medication Adjustments/Labs and Tests Ordered: Current medicines are reviewed at length with the patient today.  Concerns regarding medicines are outlined above.  Orders Placed This Encounter  Procedures   EKG 12-Lead   Meds ordered this encounter  Medications   lisinopril (ZESTRIL) 40 MG tablet    Sig: Take 1 tablet (40 mg total) by mouth daily.    Dispense:  90 tablet    Refill:  3    Signed, [name], MD  03/26/2021 9:11 AM    [place]

## 2021-03-26 NOTE — Patient Instructions (Signed)
Medication Instructions:  INCREASE the Lisinopril to 40 mg once daily  *If you need a refill on your cardiac medications before your next appointment, please call your pharmacy*   Lab Work: None ordered If you have labs (blood work) drawn today and your tests are completely normal, you will receive your results only by: [place] (if you have MyChart) OR A paper copy in the mail If you have any lab test that is abnormal or we need to change your treatment, we will call you to review the results.   Testing/Procedures: None ordered   Follow-Up: At [facility], you and your health needs are our priority.  As part of our continuing mission to provide you with exceptional heart care, we have created designated Provider Care Teams.  These Care Teams include your primary Cardiologist (physician) and Advanced Practice Providers (APPs -  Physician Assistants and Nurse Practitioners) who all work together to provide you with the care you need, when you need it.  We recommend signing up for the patient portal called "MyChart".  Sign up information is provided on this After Visit Summary.  MyChart is used to connect with patients for Virtual Visits (Telemedicine).  Patients are able to view lab/test results, encounter notes, upcoming appointments, etc.  Non-urgent messages can be sent to your provider as well.   To learn more about what you can do with MyChart, go to [email or web address].    Your next appointment:   12 month(s)  The format for your next appointment:   In Person  Provider:   [name], MD     Other Instructions Dr. Sallyanne Kuster would like you to check your blood pressure daily for the next week.  Keep a journal of these daily blood pressure and heart rate readings and call our office or send a message through [place] with the results. Thank you!  It is best to check your BP 1-2 hours after taking your medications to see the medications effectiveness on your BP.     Here are some tips that our clinical pharmacists share for home BP monitoring:          Rest 10 minutes before taking your blood pressure.          Don't smoke or drink caffeinated beverages for at least 30 minutes before.          Take your blood pressure before (not after) you eat.          Sit comfortably with your back supported and both feet on the floor (don't cross your legs).          Elevate your arm to heart level on a table or a desk.          Use the proper sized cuff. It should fit smoothly and snugly around your bare upper arm. There should be enough room to slip a fingertip under the cuff. The bottom edge of the cuff should be 1 inch above the crease of the elbow.

## 2021-04-10 ENCOUNTER — Encounter: Payer: Self-pay | Admitting: Cardiovascular Disease

## 2021-04-10 NOTE — Telephone Encounter (Signed)
Most of those blood pressure readings are within target range.  Continue medications unchanged please

## 2021-05-01 ENCOUNTER — Encounter (INDEPENDENT_AMBULATORY_CARE_PROVIDER_SITE_OTHER): Payer: Self-pay | Admitting: *Deleted

## 2021-06-05 ENCOUNTER — Other Ambulatory Visit (INDEPENDENT_AMBULATORY_CARE_PROVIDER_SITE_OTHER): Payer: Self-pay

## 2021-06-05 DIAGNOSIS — Z1211 Encounter for screening for malignant neoplasm of colon: Secondary | ICD-10-CM

## 2021-06-18 ENCOUNTER — Telehealth (INDEPENDENT_AMBULATORY_CARE_PROVIDER_SITE_OTHER): Payer: Self-pay | Admitting: *Deleted

## 2021-06-18 ENCOUNTER — Encounter (INDEPENDENT_AMBULATORY_CARE_PROVIDER_SITE_OTHER): Payer: Self-pay | Admitting: *Deleted

## 2021-06-18 MED ORDER — PEG 3350-KCL-NA BICARB-NACL 420 G PO SOLR: 4000.0000 mL | Freq: Once | ORAL | 0 refills | Status: AC

## 2021-06-18 NOTE — Telephone Encounter (Signed)
Patient needs trilyte 

## 2021-06-18 NOTE — Telephone Encounter (Signed)
Referring MD/PCP: [place] ? ?Procedure: tcs ? ?Reason/Indication:  screening ? ?Has patient had this procedure before?  Yes, 2013 ? If so, when, by whom and where?   ? ?Is there a family history of colon cancer?  no ? Who?  What age when diagnosed?   ? ?Is patient diabetic? If yes, Type 1 or Type 2   yes, type 2 ?     ?Does patient have prosthetic heart valve or mechanical valve?  no ? ?Do you have a pacemaker/defibrillator?  no ? ?Has patient ever had endocarditis/atrial fibrillation? no ? ?Does patient use oxygen? no ? ?Has patient had joint replacement within last 12 months?  no ? ?Is patient constipated or do they take laxatives? no ? ?Does patient have a history of alcohol/drug use?  no ? ?Have you had a stroke/heart attack last 6 mths? no ? ?Do you take medicine for weight loss?  no ? ?For male patients,: have you had a hysterectomy  ?                     are you post menopausal  ?                     do you still have your menstrual cycle  ? ?Is patient on blood thinner such as Coumadin, Plavix and/or Aspirin? yes ? ?Medications: asa 81 mg daily, hctz 12.5 mg daily, lisinopril 40 mg daily, metoprolol 25 mg daily, atorvastatin 40 mg daily, metformin 1000 mg daily, pantoprazole 40 mg daily, Vit B12 1000 mg daily ? ?Allergies: nkda ? ?Medication Adjustment per Dr Rehman/Dr Jenetta Downer asa 2 days, hold diabetic meds evening before procedure and morning of procedure ? ?Procedure date & time: 07/17/21  ? ? ?

## 2021-07-12 NOTE — Patient Instructions (Signed)
?   Your procedure is scheduled on: 07/17/21 ? Report to [facility] Entr[facility] at    6:10 AM. ? Call this number if you have problems the morning of surgery: [phone] ? ? Remember: ? ?            Follow Directions on the letter you received from Your Physician's office regarding the Bowel Prep ? ?            No Smoking the day of Procedure : ? ? Take these medicines the morning of surgery with A SIP OF WATER: metoprolol and pantoprazole ? ? Do not wear jewelry, make-up or nail polish. ?  ? Do not bring valuables to the hospital. ? Contacts, dentures or bridgework may not be worn into surgery. ? . ? ? Patients discharged the day of surgery will not be allowed to drive home. ?  ?  ?Colonoscopy, Adult, Care After ?This sheet gives you information about how to care for yourself after your procedure. Your health care provider may also give you more specific instructions. If you have problems or questions, contact your health care provider. ?What can I expect after the procedure? ?After the procedure, it is common to have: ?A small amount of blood in your stool for 24 hours after the procedure. ?Some gas. ?Mild abdominal cramping or bloating. ? ?Follow these instructions at home: ?General instructions ? ?For the first 24 hours after the procedure: ?Do not drive or use machinery. ?Do not sign important documents. ?Do not drink alcohol. ?Do your regular daily activities at a slower pace than normal. ?Eat soft, easy-to-digest foods. ?Rest often. ?Take over-the-counter or prescription medicines only as told by your health care provider. ?It is up to you to get the results of your procedure. Ask your health care provider, or the department performing the procedure, when your results will be ready. ?Relieving cramping and bloating ?Try walking around when you have cramps or feel bloated. ?Apply heat to your abdomen as told by your health care provider. Use a heat source that your health care provider recommends, such as a  moist heat pack or a heating pad. ?Place a towel between your skin and the heat source. ?Leave the heat on for 20-30 minutes. ?Remove the heat if your skin turns bright red. This is especially important if you are unable to feel pain, heat, or cold. You may have a greater risk of getting burned. ?Eating and drinking ?Drink enough fluid to keep your urine clear or pale yellow. ?Resume your normal diet as instructed by your health care provider. Avoid heavy or fried foods that are hard to digest. ?Avoid drinking alcohol for as long as instructed by your health care provider. ?Contact a health care provider if: ?You have blood in your stool 2-3 days after the procedure. ?Get help right away if: ?You have more than a small spotting of blood in your stool. ?You pass large blood clots in your stool. ?Your abdomen is swollen. ?You have nausea or vomiting. ?You have a fever. ?You have increasing abdominal pain that is not relieved with medicine. ?This information is not intended to replace advice given to you by your health care provider. Make sure you discuss any questions you have with your health care provider. ?Document Released: 10/09/2003 Document Revised: 11/19/2015 Document Reviewed: 05/08/2015 ?Elsevier Interactive Patient Education ? 2018 [employer] [place].  ?

## 2021-07-15 ENCOUNTER — Other Ambulatory Visit: Payer: Self-pay

## 2021-07-15 ENCOUNTER — Encounter (HOSPITAL_COMMUNITY)
Admission: RE | Admit: 2021-07-15 | Discharge: 2021-07-15 | Disposition: A | Discharge status: Home / Self Care | Payer: Medicare Other | Source: Ambulatory Visit | Attending: Internal Medicine | Admitting: Internal Medicine

## 2021-07-15 ENCOUNTER — Encounter (HOSPITAL_COMMUNITY): Payer: Self-pay

## 2021-07-15 DIAGNOSIS — Z1211 Encounter for screening for malignant neoplasm of colon: Secondary | ICD-10-CM | POA: Insufficient documentation

## 2021-07-15 DIAGNOSIS — Z01812 Encounter for preprocedural laboratory examination: Secondary | ICD-10-CM | POA: Diagnosis present

## 2021-07-15 HISTORY — DX: Type 2 diabetes mellitus without complications: E11.9

## 2021-07-15 HISTORY — DX: Gastro-esophageal reflux disease without esophagitis: K21.9

## 2021-07-15 LAB — BASIC METABOLIC PANEL
Anion gap: 9 (ref 5–15)
BUN: 16 mg/dL (ref 8–23)
CO2: 25 mmol/L (ref 22–32)
Calcium: 8.8 mg/dL — ABNORMAL LOW (ref 8.9–10.3)
Chloride: 106 mmol/L (ref 98–111)
Creatinine, Ser: 1.1 mg/dL (ref 0.61–1.24)
GFR, Estimated: >60 mL/min (ref >60)
Glucose, Bld: 161 mg/dL — ABNORMAL HIGH (ref 70–99)
Potassium: 3.3 mmol/L — ABNORMAL LOW (ref 3.5–5.1)
Sodium: 140 mmol/L (ref 135–145)

## 2021-07-17 ENCOUNTER — Encounter (HOSPITAL_COMMUNITY): Payer: Self-pay | Admitting: Internal Medicine

## 2021-07-17 ENCOUNTER — Ambulatory Visit (HOSPITAL_COMMUNITY): Payer: Medicare Other | Admitting: Certified Registered Nurse Anesthetist

## 2021-07-17 ENCOUNTER — Other Ambulatory Visit: Payer: Self-pay

## 2021-07-17 ENCOUNTER — Encounter (HOSPITAL_COMMUNITY)
Admission: RE | Disposition: A | Discharge status: Home / Self Care | Payer: Self-pay | Source: Home / Self Care | Attending: Internal Medicine

## 2021-07-17 ENCOUNTER — Ambulatory Visit (HOSPITAL_BASED_OUTPATIENT_CLINIC_OR_DEPARTMENT_OTHER): Payer: Medicare Other | Admitting: Certified Registered Nurse Anesthetist

## 2021-07-17 ENCOUNTER — Ambulatory Visit (HOSPITAL_COMMUNITY)
Admission: RE | Admit: 2021-07-17 | Discharge: 2021-07-17 | Disposition: A | Discharge status: Home / Self Care | Payer: Medicare Other | Attending: Internal Medicine | Admitting: Internal Medicine

## 2021-07-17 DIAGNOSIS — D123 Benign neoplasm of transverse colon: Secondary | ICD-10-CM | POA: Insufficient documentation

## 2021-07-17 DIAGNOSIS — K5730 Diverticulosis of large intestine without perforation or abscess without bleeding: Secondary | ICD-10-CM | POA: Diagnosis not present

## 2021-07-17 DIAGNOSIS — I10 Essential (primary) hypertension: Secondary | ICD-10-CM | POA: Diagnosis not present

## 2021-07-17 DIAGNOSIS — E119 Type 2 diabetes mellitus without complications: Secondary | ICD-10-CM | POA: Insufficient documentation

## 2021-07-17 DIAGNOSIS — Z1211 Encounter for screening for malignant neoplasm of colon: Secondary | ICD-10-CM

## 2021-07-17 DIAGNOSIS — Z7984 Long term (current) use of oral hypoglycemic drugs: Secondary | ICD-10-CM | POA: Diagnosis not present

## 2021-07-17 DIAGNOSIS — K635 Polyp of colon: Secondary | ICD-10-CM

## 2021-07-17 DIAGNOSIS — K219 Gastro-esophageal reflux disease without esophagitis: Secondary | ICD-10-CM | POA: Insufficient documentation

## 2021-07-17 DIAGNOSIS — K644 Residual hemorrhoidal skin tags: Secondary | ICD-10-CM | POA: Diagnosis not present

## 2021-07-17 DIAGNOSIS — D122 Benign neoplasm of ascending colon: Secondary | ICD-10-CM | POA: Insufficient documentation

## 2021-07-17 HISTORY — PX: POLYPECTOMY: SHX149

## 2021-07-17 HISTORY — PX: COLONOSCOPY WITH PROPOFOL: SHX5780

## 2021-07-17 LAB — HM COLONOSCOPY

## 2021-07-17 LAB — GLUCOSE, CAPILLARY: Glucose-Capillary: 93 mg/dL (ref 70–99)

## 2021-07-17 SURGERY — COLONOSCOPY WITH PROPOFOL
Anesthesia: General

## 2021-07-17 MED ORDER — PROPOFOL 500 MG/50ML IV EMUL
INTRAVENOUS | Status: DC | PRN
  Administered 2021-07-17: 175 ug/kg/min via INTRAVENOUS

## 2021-07-17 MED ORDER — LIDOCAINE HCL (CARDIAC) PF 100 MG/5ML IV SOSY
PREFILLED_SYRINGE | INTRAVENOUS | Status: DC | PRN
  Administered 2021-07-17: 60 mg via INTRATRACHEAL

## 2021-07-17 MED ORDER — PROPOFOL 10 MG/ML IV BOLUS
INTRAVENOUS | Status: DC | PRN
  Administered 2021-07-17: 90 mg via INTRAVENOUS

## 2021-07-17 MED ORDER — LACTATED RINGERS IV SOLN
INTRAVENOUS | Status: DC | PRN
Start: 2021-07-17 — End: 2021-07-17

## 2021-07-17 NOTE — H&P (Signed)
Brian Gray is an 76 y.o. male.   ?Chief Complaint: Patient is here for colonoscopy. ?HPI: Patient is 76 year old Caucasian male who is here for screening colonoscopy.  Last exam was in March 2013.  He denies abdominal pain change in bowel habits or rectal bleeding.  His appetite is good.  He remains active.  He walks every day. ?He takes naproxen occasionally.  Last aspirin dose was 3 days ago. ?Family history is negative for colon cancer. ? ?Past Medical History:  ?Diagnosis Date  ? Diabetes mellitus without complication ([place])   ? GERD (gastroesophageal reflux disease)   ? Hyperlipidemia 06/10/2018  ? Hypertension 06/10/2018  ? ? ?Past Surgical History:  ?Procedure Laterality Date  ? CARDIAC CATHETERIZATION    ? CARDIAC CATHETERIZATION  02/21/2011  ? Normal Coronary Arteries  ? COLONOSCOPY  05/15/2011  ? Procedure: COLONOSCOPY;  Surgeon: [name], MD;  [facility];  Service: Endoscopy;  Laterality: N/A;  930  ? DOPPLER ECHOCARDIOGRAPHY  03/20/2008  ? EF>55%  ? LEFT HEART CATHETERIZATION WITH CORONARY ANGIOGRAM N/A 02/21/2011  ? Procedure: LEFT HEART CATHETERIZATION WITH CORONARY ANGIOGRAM;  Surgeon: [name], MD;  Location: [facility] CATH LAB;  Service: Cardiovascular;  Laterality: N/A;  ? NM MYOVIEW LTD  02/04/2011  ? EF 58%  ? ? ?Family History  ?Problem Relation Age of Onset  ? Colon cancer Neg Hx   ? ?Social History:  reports that he has never smoked. He has never used smokeless tobacco. He reports that he does not drink alcohol and does not use drugs. ? ?Allergies:  ?Allergies  ?Allergen Reactions  ? Quinolones   ?  Unknown reaction  ? ? ?Medications Prior to Admission  ?Medication Sig Dispense Refill  ? aspirin EC 81 MG tablet Take 81 mg by mouth daily.    ? atorvastatin (LIPITOR) 40 MG tablet Take 1 tablet by mouth daily.  3  ? hydrochlorothiazide (HYDRODIURIL) 12.5 MG tablet Take 12.5 mg by mouth daily.    ? lisinopril (ZESTRIL) 40 MG tablet Take 1 tablet (40 mg total) by mouth daily. 90  tablet 3  ? metFORMIN (GLUCOPHAGE-XR) 500 MG 24 hr tablet Take 500 mg by mouth 2 (two) times daily.    ? metoprolol succinate (TOPROL-XL) 25 MG 24 hr tablet TAKE ONE-HALF TABLET BY  MOUTH DAILY (Patient taking differently: Take 25 mg by mouth daily.) 45 tablet 3  ? naproxen sodium (ANAPROX) 220 MG tablet Take 220 mg by mouth every 8 (eight) hours as needed (pain).    ? pantoprazole (PROTONIX) 40 MG tablet Take 1 tablet (40 mg total) by mouth daily. 30 tablet 1  ? potassium chloride SA (KLOR-CON M) 20 MEQ tablet Take 10 mEq by mouth daily as needed (cramps).    ? PRESCRIPTION MEDICATION Apply 1 application. topically daily as needed (dry skin). Fluticasone 0.5% & ketoconazole 2%    ? sildenafil (VIAGRA) 50 MG tablet Take 50 mg by mouth as needed for erectile dysfunction.    ? vitamin B-12 (CYANOCOBALAMIN) 1000 MCG tablet Take 1,000 mcg by mouth daily.    ? ? ?Results for orders placed or performed during the hospital encounter of 07/17/21 (from the past 48 hour(s))  ?Glucose, capillary     Status: None  ? Collection Time: 07/17/21  6:56 AM  ?Result Value Ref Range  ? Glucose-Capillary 93 70 - 99 mg/dL  ?  Comment: Glucose reference range applies only to samples taken after fasting for at least 8 hours.  ? ?No  results found. ? ?Review of Systems ? ?Blood pressure (!) 168/95, pulse 80, temperature 98 ?F (36.7 ?C), temperature source Oral, resp. rate 17, height '5\' 11"'$  (1.803 m), weight 83 kg, SpO2 97 %. ?Physical Exam ?HENT:  ?   Mouth/Throat:  ?   Mouth: Mucous membranes are moist.  ?   Pharynx: Oropharynx is clear.  ?Eyes:  ?   General: No scleral icterus. ?   Conjunctiva/sclera: Conjunctivae normal.  ?Cardiovascular:  ?   Rate and Rhythm: Normal rate and regular rhythm.  ?   Heart sounds: Normal heart sounds. No murmur heard. ?Pulmonary:  ?   Effort: Pulmonary effort is normal.  ?   Breath sounds: Normal breath sounds.  ?Abdominal:  ?   General: There is no distension.  ?   Palpations: Abdomen is soft. There is no  mass.  ?   Tenderness: There is no abdominal tenderness.  ?Musculoskeletal:     ?   General: No swelling.  ?   Cervical back: Neck supple.  ?Lymphadenopathy:  ?   Cervical: No cervical adenopathy.  ?Skin: ?   General: Skin is warm and dry.  ?Neurological:  ?   Mental Status: He is alert.  ?  ? ?Assessment/Plan ? ?Average risk screening colonoscopy. ? ?Hildred Laser, MD ?07/17/2021, 7:23 AM ? ? ? ?

## 2021-07-17 NOTE — Op Note (Signed)
[facility] ?Patient Name: Brian Gray ?Procedure Date: 07/17/2021 7:08 AM ?MRN: [id number] ?Date of Birth: [birth year] ?Attending MD: Hildred Laser , MD ?CSN: [id number] ?Age: 76 ?Admit Type: Outpatient ?Procedure:                Colonoscopy ?Indications:              Screening for colorectal malignant neoplasm ?Providers:                Hildred Laser, MD, Arlington Page, Raphael Gibney,  ?                          Technician ?Referring MD:             [name], MD ?Medicines:                Propofol per Anesthesia ?Complications:            No immediate complications. ?Estimated Blood Loss:     Estimated blood loss was minimal. ?Procedure:                Pre-Anesthesia Assessment: ?                          - Prior to the procedure, a History and Physical  ?                          was performed, and patient medications and  ?                          allergies were reviewed. The patient's tolerance of  ?                          previous anesthesia was also reviewed. The risks  ?                          and benefits of the procedure and the sedation  ?                          options and risks were discussed with the patient.  ?                          All questions were answered, and informed consent  ?                          was obtained. Prior Anticoagulants: The patient has  ?                          taken no previous anticoagulant or antiplatelet  ?                          agents except for aspirin. ASA Grade Assessment: II  ?                          - A patient with mild systemic disease. After  ?  reviewing the risks and benefits, the patient was  ?                          deemed in satisfactory condition to undergo the  ?                          procedure. ?                          After obtaining informed consent, the colonoscope  ?                          was passed under direct vision. Throughout the  ?                          procedure, the patient's blood  pressure, pulse, and  ?                          oxygen saturations were monitored continuously. The  ?                          PCF-HQ190L ([id number]) scope was introduced through  ?                          the anus and advanced to the the cecum, identified  ?                          by appendiceal orifice and ileocecal valve. The  ?                          colonoscopy was performed without difficulty. The  ?                          patient tolerated the procedure well. The quality  ?                          of the bowel preparation was good. The ileocecal  ?                          valve, appendiceal orifice, and rectum were  ?                          photographed. ?Scope In: 7:33:40 AM ?Scope Out: 7:54:34 AM ?Scope Withdrawal Time: 0 hours 9 minutes 34 seconds  ?Total Procedure Duration: 0 hours 20 minutes 54 seconds  ?Findings: ?     The perianal and digital rectal examinations were normal. ?     A small polyp was found in the hepatic flexure. Biopsies were taken with  ?     a cold forceps for histology. The pathology specimen was placed into  ?     Bottle Number 1. ?     A small polyp was found in the ascending colon. The polyp was flat. The  ?     polyp was removed with a cold snare. Resection and retrieval were  ?     complete. The  pathology specimen was placed into Bottle Number 1. ?     Multiple small and large-mouthed diverticula were found in the sigmoid  ?     colon, descending colon and splenic flexure. ?     External hemorrhoids were found during retroflexion. The hemorrhoids  ?     were small. ?Impression:               - One small polyp at the hepatic flexure. Biopsied. ?                          - One small polyp in the ascending colon, removed  ?                          with a cold snare. Resected and retrieved. ?                          - Diverticulosis in the sigmoid colon, in the  ?                          descending colon and at the splenic flexure. ?                          - External  hemorrhoids. ?Moderate Sedation: ?     Per Anesthesia Care ?Recommendation:           - Patient has a contact number available for  ?                          emergencies. The signs and symptoms of potential  ?                          delayed complications were discussed with the  ?                          patient. Return to normal activities tomorrow.  ?                          Written discharge instructions were provided to the  ?                          patient. ?                          - High fiber diet and diabetic (ADA) diet today. ?                          - Continue present medications. ?                          - No aspirin, ibuprofen, naproxen, or other  ?                          non-steroidal anti-inflammatory drugs for 1 day. ?                          - Await pathology results. ?                          -  Repeat colonoscopy is recommended. The  ?                          colonoscopy date will be determined after pathology  ?                          results from today's exam become available for  ?                          review. ?Procedure Code(s):        --- Professional --- ?                          [phone], Colonoscopy, flexible; with removal of  ?                          tumor(s), polyp(s), or other lesion(s) by snare  ?                          technique ?                          45380, 59, Colonoscopy, flexible; with biopsy,  ?                          single or multiple ?Diagnosis Code(s):        --- Professional --- ?                          Z12.11, Encounter for screening for malignant  ?                          neoplasm of colon ?                          K63.5, Polyp of colon ?                          K64.4, Residual hemorrhoidal skin tags ?                          K57.30, Diverticulosis of large intestine without  ?                          perforation or abscess without bleeding ?CPT copyright 2019 [employer]. All rights reserved. ?The codes documented in  this report are preliminary and upon coder review may  ?be revised to meet current compliance requirements. ?Hildred Laser, MD ?Hildred Laser, MD ?07/17/2021 8:01:46 AM ?This report has been signed electronically. ?Number of Addenda: 0 ?

## 2021-07-17 NOTE — Transfer of Care (Signed)
Immediate Anesthesia Transfer of Care Note ? ?Patient: Brian Gray ? ?Procedure(s) Performed: COLONOSCOPY WITH PROPOFOL ?POLYPECTOMY INTESTINAL ? ?Patient Location: Short Stay ? ?Anesthesia Type:General ? ?Level of Consciousness: sedated ? ?Airway & Oxygen Therapy: Patient Spontanous Breathing ? ?Post-op Assessment: Report given to RN and Post -op Vital signs reviewed and stable ? ?Post vital signs: Reviewed and stable ? ?Last Vitals:  ?Vitals Value Taken Time  ?BP    ?Temp    ?Pulse    ?Resp    ?SpO2    ? ? ?Last Pain:  ?Vitals:  ? 07/17/21 0730  ?TempSrc:   ?PainSc: 0-No pain  ?   ? ?  ? ?Complications: No notable events documented. ?

## 2021-07-17 NOTE — Discharge Instructions (Signed)
Resume aspirin on 07/18/2021 ?Resume other medications as before ?Modified carb high-fiber diet ?No driving for 24 hours ?Physician will call with biopsy results. ?

## 2021-07-17 NOTE — Anesthesia Preprocedure Evaluation (Signed)
Anesthesia Evaluation  ?Patient identified by MRN, date of birth, ID band ?Patient awake ? ? ? ?Reviewed: ?Allergy & Precautions, H&P , NPO status , Patient's Chart, lab work & pertinent test results, reviewed documented beta blocker date and time  ? ?Airway ?Mallampati: II ? ?TM Distance: >3 FB ?Neck ROM: full ? ? ? Dental ?no notable dental hx. ? ?  ?Pulmonary ?neg pulmonary ROS,  ?  ?Pulmonary exam normal ?breath sounds clear to auscultation ? ? ? ? ? ? Cardiovascular ?Exercise Tolerance: Good ?hypertension, negative cardio ROS ? ? ?Rhythm:regular Rate:Normal ? ? ?  ?Neuro/Psych ? Neuromuscular disease negative psych ROS  ? GI/Hepatic ?Neg liver ROS, GERD  Medicated,  ?Endo/Other  ?negative endocrine ROSdiabetes, Type 2 ? Renal/GU ?negative Renal ROS  ?negative genitourinary ?  ?Musculoskeletal ? ? Abdominal ?  ?Peds ? Hematology ?negative hematology ROS ?(+)   ?Anesthesia Other Findings ? ? Reproductive/Obstetrics ?negative OB ROS ? ?  ? ? ? ? ? ? ? ? ? ? ? ? ? ?  ?  ? ? ? ? ? ? ? ? ?Anesthesia Physical ?Anesthesia Plan ? ?ASA: 2 ? ?Anesthesia Plan: General  ? ?Post-op Pain Management:   ? ?Induction:  ? ?PONV Risk Score and Plan: Propofol infusion ? ?Airway Management Planned:  ? ?Additional Equipment:  ? ?Intra-op Plan:  ? ?Post-operative Plan:  ? ?Informed Consent: I have reviewed the patients History and Physical, chart, labs and discussed the procedure including the risks, benefits and alternatives for the proposed anesthesia with the patient or authorized representative who has indicated his/her understanding and acceptance.  ? ? ? ?Dental Advisory Given ? ?Plan Discussed with: CRNA ? ?Anesthesia Plan Comments:   ? ? ? ? ? ? ?Anesthesia Quick Evaluation ? ?

## 2021-07-18 ENCOUNTER — Encounter (INDEPENDENT_AMBULATORY_CARE_PROVIDER_SITE_OTHER): Payer: Self-pay | Admitting: *Deleted

## 2021-07-18 LAB — SURGICAL PATHOLOGY

## 2021-07-18 NOTE — Anesthesia Postprocedure Evaluation (Signed)
Anesthesia Post Note ? ?Patient: MALAQUIAS LENKER ? ?Procedure(s) Performed: COLONOSCOPY WITH PROPOFOL ?POLYPECTOMY INTESTINAL ? ?Patient location during evaluation: Phase II ?Anesthesia Type: General ?Level of consciousness: awake ?Pain management: pain level controlled ?Vital Signs Assessment: post-procedure vital signs reviewed and stable ?Respiratory status: spontaneous breathing and respiratory function stable ?Cardiovascular status: blood pressure returned to baseline and stable ?Postop Assessment: no headache and no apparent nausea or vomiting ?Anesthetic complications: no ?Comments: Late entry ? ? ?No notable events documented. ? ? ?Last Vitals:  ?Vitals:  ? 07/17/21 0806 07/17/21 0813  ?BP: (!) 96/59 119/70  ?Pulse:    ?Resp:    ?Temp:    ?SpO2:    ?  ?Last Pain:  ?Vitals:  ? 07/17/21 0758  ?TempSrc: Oral  ?PainSc:   ? ? ?  ?  ?  ?  ?  ?  ? ?Louann Sjogren ? ? ? ? ?

## 2021-07-24 ENCOUNTER — Encounter (HOSPITAL_COMMUNITY): Payer: Self-pay | Admitting: Internal Medicine

## 2021-11-15 ENCOUNTER — Other Ambulatory Visit: Payer: Self-pay | Admitting: Surgery

## 2021-11-15 DIAGNOSIS — I712 Thoracic aortic aneurysm, without rupture, unspecified: Secondary | ICD-10-CM

## 2021-12-07 ENCOUNTER — Other Ambulatory Visit: Payer: Self-pay | Admitting: Cardiovascular Disease

## 2021-12-24 ENCOUNTER — Ambulatory Visit: Payer: Medicare Other | Admitting: Physician Assistant

## 2021-12-24 ENCOUNTER — Ambulatory Visit
Admission: RE | Admit: 2021-12-24 | Discharge: 2021-12-24 | Disposition: A | Discharge status: Home / Self Care | Payer: Medicare Other | Source: Ambulatory Visit | Attending: Surgery | Admitting: Surgery

## 2021-12-24 VITALS — BP 157/92 | HR 65 | Resp 18 | Ht 71.0 in | Wt 181.0 lb

## 2021-12-24 DIAGNOSIS — I712 Thoracic aortic aneurysm, without rupture, unspecified: Secondary | ICD-10-CM

## 2021-12-24 DIAGNOSIS — I77810 Thoracic aortic ectasia: Secondary | ICD-10-CM

## 2021-12-24 MED ORDER — IOPAMIDOL (ISOVUE-370) INJECTION 76%
75.0000 mL | Freq: Once | INTRAVENOUS | Status: AC | PRN
  Administered 2021-12-24: 75 mL via INTRAVENOUS

## 2021-12-24 NOTE — Progress Notes (Signed)
[address]       [place] [address]             [phone]        Brian Gray [id number] 1945-08-01  History of Present Illness:  Patient is a 76 year old male followed in the office for mildly dilated ascending aorta.  He was originally seen in February 2011 by Dr. Servando Gray for a dilated acending aorta at that time there was approximately 4.4 cm in size and had been known about for 4-5 years. He has had  a cardiac catheterization 02/21/11 that showed no significant stenosis. Last  Echocardiogram in 2010 noted trileaflet aortic valve.  He presents to the office for his 1 year follow-up for aortic aneurysm surveillance and CTA.  The patient continues to do well.  He denies chest pain, shortness of breath, and stroke like symptoms.  He has never smoked.  He is active and he can do whatever he needs without difficulty.   Current Outpatient Medications on File Prior to Visit  Medication Sig Dispense Refill   aspirin EC 81 MG tablet Take 1 tablet (81 mg total) by mouth daily. 30 tablet 11   atorvastatin (LIPITOR) 40 MG tablet Take 1 tablet by mouth daily.  3   hydrochlorothiazide (HYDRODIURIL) 12.5 MG tablet Take 12.5 mg by mouth daily.     lisinopril (ZESTRIL) 40 MG tablet Take 1 tablet (40 mg total) by mouth daily. 90 tablet 3   metFORMIN (GLUCOPHAGE-XR) 500 MG 24 hr tablet Take 500 mg by mouth 2 (two) times daily.     metoprolol succinate (TOPROL-XL) 25 MG 24 hr tablet TAKE ONE-HALF TABLET BY MOUTH  DAILY 45 tablet 3   naproxen sodium (ANAPROX) 220 MG tablet Take 220 mg by mouth every 8 (eight) hours as needed (pain).     pantoprazole (PROTONIX) 40 MG tablet Take 1 tablet (40 mg total) by mouth daily. 30 tablet 1   potassium chloride SA (KLOR-CON M) 20 MEQ tablet Take 10 mEq by mouth daily as needed (cramps).     PRESCRIPTION MEDICATION Apply 1 application. topically daily as needed (dry skin). Fluticasone 0.5% & ketoconazole 2%     sildenafil (VIAGRA) 50 MG tablet  Take 50 mg by mouth as needed for erectile dysfunction.     vitamin B-12 (CYANOCOBALAMIN) 1000 MCG tablet Take 1,000 mcg by mouth daily.     No current facility-administered medications on file prior to visit.     There were no vitals taken for this visit.  Physical Exam  CTA Results:  FINDINGS: Cardiovascular:   Heart:   No cardiomegaly. No pericardial fluid/thickening. Calcifications of the left main, left anterior descending coronary arteries.   Aorta:   Minimal aortic atherosclerosis.   Greatest estimated diameter of the aortic annulus 25 mm on the coronal reformatted images.   Greatest estimated diameter of the sino-tubular junction, 33 mm on the coronal images.   Greatest estimated diameter of the ascending aorta on the axial images, 42 mm.   No significant atherosclerotic changes of the aorta. Branch vessels are patent with common origin of the innominate artery an the left common carotid artery. Cervical cerebral vessels patent at the base of the neck.   No pedunculated plaque, ulcerated plaque, dissection, periaortic fluid. No wall thickening.   Pulmonary arteries:   Timing of the contrast bolus is not optimized for evaluation of pulmonary artery filling defects. Unremarkable size of the main pulmonary artery.   Mediastinum/Nodes: No  mediastinal adenopathy. Unremarkable appearance of the thoracic esophagus.   Unremarkable appearance of the thoracic inlet.   Lungs/Pleura: Central airways are clear. No pleural effusion. No confluent airspace disease.   No pneumothorax.   Upper Abdomen: No acute finding of the upper abdomen. 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   Musculoskeletal: No acute displaced fracture. Degenerative changes of the spine.   Review of the MIP images confirms the above findings.   IMPRESSION: Essentially unchanged size  and configuration of the ascending aorta, estimated 4.2 cm on the current CT angiogram.   Coronary artery disease and minimal aortic atherosclerosis. Aortic Atherosclerosis (ICD10-I70.0).   Additional ancillary findings as above.   Signed,   Brian Gray. Brian Gray, RPVI   [facility]   [facility]     Electronically Signed   By: Brian Gray D.O.   On: 12/24/2021 13:51    A/P:  Ascending Aortic Aneurysm measuring 4.2 cm which is essentially unchanged HTN- SBP elevated today, patient has follow up with primary physician next week if remains elevated will need adjustment in medication RTC in 1  year with repeat CTA chest      Risk Modification:  Statin:  No HLD  Smoking cessation instruction/counseling given:  never smoker  Patient was counseled on importance of Blood Pressure Control.  Despite Medical intervention if the patient notices persistently elevated blood pressure readings.  They are instructed to contact their Primary Care Physician  Please avoid use of Fluoroquinolones as this can potentially increase your risk of Aortic Rupture and/or Dissection  Patient educated on signs and symptoms of Aortic Dissection, handout also provided in AVS  Brian Alcivar, PA-C 12/24/21

## 2021-12-24 NOTE — Patient Instructions (Signed)
Make every effort to maintain a "heart-healthy" lifestyle with regular physical exercise and adherence to a low-fat, low-carbohydrate diet.  Continue to seek regular follow-up appointments with your primary care physician and/or cardiologist.   AVOID FLUOROQUINOLONE (ex: Cipro) AS THESE CAN INCREASE YOUR RIST OF AORTIC DISSCTION

## 2022-03-09 ENCOUNTER — Other Ambulatory Visit: Payer: Self-pay | Admitting: Cardiovascular Disease

## 2022-03-13 ENCOUNTER — Encounter: Payer: Self-pay | Admitting: Cardiovascular Disease

## 2022-03-13 NOTE — Telephone Encounter (Signed)
Called patient again. I asked Any shortness of breath, pain in your chest, pain in arm, pain in back, dizziness, light-headedness, sweating, or any swelling. He reports that he does not have any of these symptoms. Reports feeling pressure in chest for about 69mnutes at a time. The pain started yesterday- on and off throughout the day and it came back this afternoon. It will last about 13mutes, will feel like something laying on his chest/heart. He said that the pressure is no longer there at this time. At present patient states he feels okay. I told him that if he feels this pressure again, then he needs to go to the ER to be evaluated. Scheduled patient at next available appt. With DOD Dr HaEllyn Hackn Monday 03/17/22 at 0830. Pt is aware of all instructions and verbalizes understanding. Will forward to MD to make aware.

## 2022-03-14 NOTE — Telephone Encounter (Signed)
Pain sounds more like cervical radiculopathy to me.  If his ECG is normal, probably best suited for coronary CT angiogram to make sure this is not coronary disease (he has a history of false positive nuclear stress test back in 2012 when he had a heart cath without any coronary stenoses).

## 2022-03-17 ENCOUNTER — Encounter: Payer: Self-pay | Admitting: Cardiology

## 2022-03-17 ENCOUNTER — Ambulatory Visit: Payer: Medicare Other | Attending: Cardiology | Admitting: Cardiology

## 2022-03-17 VITALS — BP 132/84 | HR 69 | Ht 71.0 in | Wt 181.0 lb

## 2022-03-17 DIAGNOSIS — E782 Mixed hyperlipidemia: Secondary | ICD-10-CM | POA: Diagnosis not present

## 2022-03-17 DIAGNOSIS — I77810 Thoracic aortic ectasia: Secondary | ICD-10-CM | POA: Diagnosis not present

## 2022-03-17 DIAGNOSIS — I10 Essential (primary) hypertension: Secondary | ICD-10-CM

## 2022-03-17 DIAGNOSIS — R0789 Other chest pain: Secondary | ICD-10-CM | POA: Insufficient documentation

## 2022-03-17 NOTE — Patient Instructions (Signed)
Medication Instructions:    Start taking Protonix daily   You may take Tums if you have breakthrough discomfort  *If you need a refill on your cardiac medications before your next appointment, please call your pharmacy*   Lab Work: Not needed    Testing/Procedures: Not needed   Follow-Up: At [facility], you and your health needs are our priority.  As part of our continuing mission to provide you with exceptional heart care, we have created designated Provider Care Teams.  These Care Teams include your primary Cardiologist (physician) and Advanced Practice Providers (APPs -  Physician Assistants and Nurse Practitioners) who all work together to provide you with the care you need, when you need it.     Your next appointment:   1 month(s)  The format for your next appointment:   In Person  Provider:   [name], MD or APP   Other Instructions   Contact office if symptoms become worse or with exertion or lasting longer in duration

## 2022-03-17 NOTE — Progress Notes (Signed)
Primary Care Provider: Lemmie Gray, [place] Cardiologist: [name], MD Electrophysiologist: None  Clinic Note: Chief Complaint  Patient presents with   Chest Pain   ===================================  ASSESSMENT/PLAN   Problem List Items Addressed This Visit       Cardiology Problems   Hyperlipidemia    Lipid panel not available.  Will need to have labs from PCP when he sees Dr. Sallyanne Kuster      Hypertension (Chronic)    BP well-controlled today.  He is on low-dose beta-blocker as well as higher dose ACE inhibitor and HCTZ.  Will follow closely.      Ascending aorta dilatation (HCC) (Chronic)    Appears to be stable with a measurement of 4.2 cm on most recent CT scan.  Still follow-up with CVTS although I do not see them operating anytime soon. Continue to monitor blood pressure which is pretty well-controlled today.        Other   Chest pressure - Primary    He is describing a pressure sensation in his chest which seems to correlate with having adjusted his PPI dose.  It is not exertional and short-lived.  Not made worse with exertion.  I do not think the sound like anginal symptoms.  My recommendation would be to go back to twice daily dosing of the PPI to see if symptoms improve.  He can follow back up in a month with Dr. Lewie Loron.  If symptoms do get worse with exertion, and become more prominent, would then consider ischemic evaluation in this case with a coronary CTA.  However her symptoms have improved with adjustment of PPI, but would not do ischemic evaluation.      Relevant Orders   EKG 12-Lead (Completed)    ===================================  HPI:    Brian Gray is a 77 y.o. male with a PMH notable for mild ascending aortic dilation (4.4 cm), false positive stress test with negative cath, HTN, HLD and borderline DM-2 as well as GERD who presents today for DOD work-in visit for chest discomfort.  Brian Gray was last seen by Dr.  Sallyanne Kuster on March 26, 2021 for routine annual follow-up.  Denied any chest pain/pressure or dyspnea at rest or exertion.  No cardiac symptoms.  Blood pressures were little bit high.  No PND, orthopnea, or edema. => Noted that the acing aorta was being followed by Dr. Cyndia Bent with routine CT scans.  BP was elevated, increased Toprol to 25 mg daily and lisinopril 40 mg daily.  Recommended BP log.    Recent Hospitalizations: None  Reviewed  CV studies:    The following studies were reviewed today: (if available, images/films reviewed: From Epic Chart or Care Everywhere) CT Angio-Aorta 12/24/2021: Essentially unchanged ascending thoracic aorta estimated at 4.2 cm on current CT scan.  Coronary artery atherosclerotic disease with minimal aortic atherosclerosis noted.  Interval History:   Brian Gray presents here today as a work in patient, DOD today with complaint of chest pressure.  He describes having a pressure sensation around his chest circling up around his left upper chest from the middle out laterally.  He says been having these episodes off and on for the last 2 to 3 days.  The episodes last 1 or 2 minutes and then go away.  They are not associate with activity.  Belching or drinking some water makes it feel better.  Laying down makes it worse.  Usually notes it is worse after eating.  He does  note that he recently stopped taking his PPI twice daily is not taking it once a day from PCP recommendation.  He says that the symptoms started began a few days after making that change.  Besides these episodes of chest discomfort, he is very active does all kinds of yard work activities and walks regularly without exacerbation of chest discomfort.  No PND, orthopnea or edema.  No exertional dyspnea.  No irregular heartbeats palpitations.  No syncope or near syncope.   REVIEWED OF SYSTEMS   Review of Systems  Constitutional:  Negative for malaise/fatigue and weight loss.  HENT:  Negative for  congestion.   Respiratory:  Negative for cough.   Gastrointestinal:  Positive for abdominal pain and heartburn. Negative for blood in stool and melena.  Genitourinary:  Negative for hematuria.  Musculoskeletal:  Negative for joint pain.  Neurological: Negative.   Psychiatric/Behavioral: Negative.    All other systems reviewed and are negative.   I have reviewed and (if needed) personally updated the patient's problem list, medications, allergies, past medical and surgical history, social and family history.   PAST MEDICAL HISTORY   Past Medical History:  Diagnosis Date   Diabetes mellitus without complication ([place])    GERD (gastroesophageal reflux disease)    Hyperlipidemia 06/10/2018   Hypertension 06/10/2018    PAST SURGICAL HISTORY   Past Surgical History:  Procedure Laterality Date   COLONOSCOPY  05/15/2011   Procedure: COLONOSCOPY;  Surgeon: [name], MD;  [facility];  Service: Endoscopy;  Laterality: N/A;  930   COLONOSCOPY WITH PROPOFOL N/A 07/17/2021   Procedure: COLONOSCOPY WITH PROPOFOL;  Surgeon: [name], MD;  [facility];  Service: Endoscopy;  Laterality: N/A;  [place]  03/20/2008   EF>55%   LEFT HEART CATHETERIZATION WITH CORONARY ANGIOGRAM N/A 02/21/2011   Procedure: LEFT HEART CATHETERIZATION WITH CORONARY ANGIOGRAM;  Surgeon: [name], MD;  Location: [facility];  Service: Cardiovascular;  Normal Coronary Arteries   NM MYOVIEW LTD  02/04/2011   EF 58%   POLYPECTOMY  07/17/2021   Procedure: POLYPECTOMY INTESTINAL;  Surgeon: [name], MD;  [facility];  Service: Endoscopy;;    Immunization History  Administered Date(s) Administered   Influenza, High Dose Seasonal PF 06/10/2018    MEDICATIONS/ALLERGIES   Current Meds  Medication Sig   aspirin EC 81 MG tablet Take 1 tablet (81 mg total) by mouth daily.   atorvastatin (LIPITOR) 40 MG tablet Take 1 tablet by mouth  daily.   hydrochlorothiazide (HYDRODIURIL) 12.5 MG tablet Take 12.5 mg by mouth daily.   lisinopril (ZESTRIL) 40 MG tablet TAKE 1 TABLET BY MOUTH EVERY DAY   metFORMIN (GLUCOPHAGE-XR) 500 MG 24 hr tablet Take 500 mg by mouth 2 (two) times daily.   metoprolol succinate (TOPROL-XL) 25 MG 24 hr tablet TAKE ONE-HALF TABLET BY MOUTH  DAILY   naproxen sodium (ANAPROX) 220 MG tablet Take 220 mg by mouth every 8 (eight) hours as needed (pain).   pantoprazole (PROTONIX) 40 MG tablet Take 1 tablet (40 mg total) by mouth daily.   potassium chloride SA (KLOR-CON M) 20 MEQ tablet Take 10 mEq by mouth daily as needed (cramps).   PRESCRIPTION MEDICATION Apply 1 application. topically daily as needed (dry skin). Fluticasone 0.5% & ketoconazole 2%   sildenafil (VIAGRA) 50 MG tablet Take 50 mg by mouth as needed for erectile dysfunction.   vitamin B-12 (CYANOCOBALAMIN) 1000 MCG tablet Take 1,000 mcg by  mouth daily.    Allergies  Allergen Reactions   Quinolones     Unknown reaction    SOCIAL HISTORY/FAMILY HISTORY   Reviewed in Epic:  Pertinent findings:  Social History   Tobacco Use   Smoking status: Never   Smokeless tobacco: Never  Vaping Use   Vaping Use: Never used  Substance Use Topics   Alcohol use: No   Drug use: No   Social History   Social History Narrative   Not on file    OBJCTIVE -PE, EKG, labs   Wt Readings from Last 3 Encounters:  03/17/22 181 lb (82.1 kg)  12/24/21 181 lb (82.1 kg)  07/17/21 182 lb 15.7 oz (83 kg)    Physical Exam: BP 132/84 (BP Location: Left Arm, Patient Position: Sitting, Cuff Size: Normal)   Pulse 69   Ht '5\' 11"'$  (1.803 m)   Wt 181 lb (82.1 kg)   SpO2 98%   BMI 25.24 kg/m  Physical Exam Vitals reviewed.  Constitutional:      General: He is not in acute distress.    Appearance: Normal appearance. He is normal weight. He is not ill-appearing, toxic-appearing or diaphoretic.  HENT:     Head: Normocephalic.  Neck:     Vascular: No carotid  bruit or JVD.  Cardiovascular:     Rate and Rhythm: Normal rate and regular rhythm. No extrasystoles are present.    Chest Wall: PMI is not displaced.     Pulses: Normal pulses.     Heart sounds: Normal heart sounds, S1 normal and S2 normal. No murmur heard.    No friction rub. No gallop.  Pulmonary:     Effort: Pulmonary effort is normal. No respiratory distress.     Breath sounds: Normal breath sounds. No wheezing or rales.  Chest:     Chest wall: No tenderness.  Musculoskeletal:        General: No swelling. Normal range of motion.     Cervical back: Normal range of motion and neck supple.  Skin:    General: Skin is warm and dry.  Neurological:     General: No focal deficit present.     Mental Status: He is alert and oriented to person, place, and time.  Psychiatric:        Behavior: Behavior normal.        Thought Content: Thought content normal.        Judgment: Judgment normal.      Adult ECG Report  Rate: 69 ;  Rhythm: normal sinus rhythm and normal axis, intervals durations. ;   Narrative Interpretation: Normal  Recent Labs: Reviewed No results found for: "CHOL", "HDL", "LDLCALC", "LDLDIRECT", "TRIG", "CHOLHDL" Lab Results  Component Value Date   CREATININE 1.10 07/15/2021   BUN 16 07/15/2021   NA 140 07/15/2021   K 3.3 (L) 07/15/2021   CL 106 07/15/2021   CO2 25 07/15/2021      Latest Ref Rng & Units 06/09/2018   11:42 PM  CBC  WBC 4.0 - 10.5 K/uL 7.1   Hemoglobin 13.0 - 17.0 g/dL 13.6   Hematocrit 39.0 - 52.0 % 40.9   Platelets 150 - 400 K/uL 211     Lab Results  Component Value Date   HGBA1C 7.9 (H) 06/10/2018   No results found for: "TSH"  ================================================== I spent a total of 20 minutes with the patient spent in direct patient consultation.  Additional time spent with chart review  / charting (studies, outside notes, etc):  15 min Total Time: 35 min  Current medicines are reviewed at length with the patient today.   (+/- concerns) none besides the fact that he recently cut back his PPI to once daily.  Notice: This dictation was prepared with Dragon dictation along with smart phrase technology. Any transcriptional errors that result from this process are unintentional and may not be corrected upon review.  Studies Ordered:   Orders Placed This Encounter  Procedures   EKG 12-Lead   No orders of the defined types were placed in this encounter.   Patient Instructions / Medication Changes & Studies & Tests Ordered   Patient Instructions  Medication Instructions:    Start taking Protonix daily   You may take Tums if you have breakthrough discomfort  *If you need a refill on your cardiac medications before your next appointment, please call your pharmacy*   Lab Work: Not needed    Testing/Procedures: Not needed   Follow-Up: At [facility], you and your health needs are our priority.  As part of our continuing mission to provide you with exceptional heart care, we have created designated Provider Care Teams.  These Care Teams include your primary Cardiologist (physician) and Advanced Practice Providers (APPs -  Physician Assistants and Nurse Practitioners) who all work together to provide you with the care you need, when you need it.     Your next appointment:   1 month(s)  The format for your next appointment:   In Person  Provider:   [name], MD or APP   Other Instructions   Contact office if symptoms become worse or with exertion or lasting longer in duration      Leonie Man, MD, MS Glenetta Hew, M.D., M.S. Interventional Cardiologist  [place]  Pager # [phone] Phone # [phone] [place], [place] [address]   Thank you for choosing [facility]!!

## 2022-03-19 NOTE — Telephone Encounter (Signed)
Spoke to patient he stated he was prescribed Protonix 40 mg daily 2 days ago.Stated he feels fine.No chest pressure.Stated he will discuss with Dr.Croitoru at his visit scheduled 04/14/22 at 4:20 pm.

## 2022-03-22 ENCOUNTER — Encounter: Payer: Self-pay | Admitting: Cardiology

## 2022-03-22 NOTE — Assessment & Plan Note (Addendum)
Appears to be stable with a measurement of 4.2 cm on most recent CT scan.  Still follow-up with CVTS although I do not see them operating anytime soon. Continue to monitor blood pressure which is pretty well-controlled today.

## 2022-03-22 NOTE — Assessment & Plan Note (Signed)
BP well-controlled today.  He is on low-dose beta-blocker as well as higher dose ACE inhibitor and HCTZ.  Will follow closely.

## 2022-03-22 NOTE — Assessment & Plan Note (Signed)
Lipid panel not available.  Will need to have labs from PCP when he sees Dr. Sallyanne Kuster

## 2022-03-22 NOTE — Assessment & Plan Note (Signed)
He is describing a pressure sensation in his chest which seems to correlate with having adjusted his PPI dose.  It is not exertional and short-lived.  Not made worse with exertion.  I do not think the sound like anginal symptoms.  My recommendation would be to go back to twice daily dosing of the PPI to see if symptoms improve.  He can follow back up in a month with Dr. Lewie Loron.  If symptoms do get worse with exertion, and become more prominent, would then consider ischemic evaluation in this case with a coronary CTA.  However her symptoms have improved with adjustment of PPI, but would not do ischemic evaluation.

## 2022-04-14 ENCOUNTER — Encounter: Payer: Self-pay | Admitting: Cardiovascular Disease

## 2022-04-14 ENCOUNTER — Ambulatory Visit: Payer: Medicare Other | Attending: Cardiovascular Disease | Admitting: Cardiovascular Disease

## 2022-04-14 VITALS — BP 142/87 | HR 76 | Ht 71.0 in | Wt 187.0 lb

## 2022-04-14 DIAGNOSIS — E7800 Pure hypercholesterolemia, unspecified: Secondary | ICD-10-CM

## 2022-04-14 DIAGNOSIS — I10 Essential (primary) hypertension: Secondary | ICD-10-CM

## 2022-04-14 DIAGNOSIS — E119 Type 2 diabetes mellitus without complications: Secondary | ICD-10-CM

## 2022-04-14 DIAGNOSIS — I7121 Aneurysm of the ascending aorta, without rupture: Secondary | ICD-10-CM | POA: Diagnosis not present

## 2022-04-14 MED ORDER — METOPROLOL SUCCINATE ER 25 MG PO TB24: 25.0000 mg | ORAL_TABLET | Freq: Every day | ORAL | 3 refills | Status: DC

## 2022-04-14 NOTE — Patient Instructions (Signed)
Medication Instructions:  No changes *If you need a refill on your cardiac medications before your next appointment, please call your pharmacy*  Follow-Up: At [facility], you and your health needs are our priority.  As part of our continuing mission to provide you with exceptional heart care, we have created designated Provider Care Teams.  These Care Teams include your primary Cardiologist (physician) and Advanced Practice Providers (APPs -  Physician Assistants and Nurse Practitioners) who all work together to provide you with the care you need, when you need it.  We recommend signing up for the patient portal called "MyChart".  Sign up information is provided on this After Visit Summary.  MyChart is used to connect with patients for Virtual Visits (Telemedicine).  Patients are able to view lab/test results, encounter notes, upcoming appointments, etc.  Non-urgent messages can be sent to your provider as well.   To learn more about what you can do with MyChart, go to [email or web address].    Your next appointment:   1 year(s)  Provider:   Mihai Croitoru, MD      

## 2022-04-14 NOTE — Progress Notes (Unsigned)
Cardiology Office Note:    Date:  04/15/2022   ID:  BRIGIDO MERA, DOB Dec 14, 1945, MRN [id number]  PCP:  [name], MD  Cardiologist:  [name], MD    Referring MD: [name], MD   chief complaint: F/u ascending aortic aneurysm, hypertension, hyperlipidemia  History of Present Illness:    Brian Gray is a 77 y.o. male with a hx of mild dilation of the ascending aorta (4.3-4.4 cm, stable since 2010, most recent CTA October 2022), history of false positive nuclear stress test leading to a coronary angiogram in 2012 that showed no significant stenoses, treated hyperlipidemia, hypertension and borderline diabetes mellitus, returning for follow-up.  Recent problems with chest discomfort at rest appear to be related to adjustment in his proton pump inhibitor.  He recently saw Dr. Glenetta Hew.  Symptoms were fairly clearly associated with a gastrointestinal cause symptoms drinking water belching made it better and eating or laying flat made it worse.  Otherwise he has no cardiovascular complaints.  He remains physically active.  The patient specifically denies any chest pain with exertion, dyspnea at rest or with exertion, orthopnea, paroxysmal nocturnal dyspnea, syncope, palpitations, focal neurological deficits, intermittent claudication, lower extremity edema, unexplained weight gain, cough, hemoptysis or wheezing.  His blood pressure has been running borderline high with systolic blood pressure typically in the 140's.  His blood pressure is 132/84 when he saw Dr. Ellyn Hack, but today it is 144/78 and it has been running the same at home.  He is followed by Dr. Cyndia Bent for his ascending aortic aneurysm which has been stable dimensions.  The most recent CT angiogram was performed December 24, 2021 and showed an unchanged maximum diameter of 42 mm.  His metabolic abnormalities are followed by Dr. Karie Kirks.  He is due to have labs next month.  Past Medical History:  Diagnosis Date    Diabetes mellitus without complication ([place])    GERD (gastroesophageal reflux disease)    Hyperlipidemia 06/10/2018   Hypertension 06/10/2018    Past Surgical History:  Procedure Laterality Date   COLONOSCOPY  05/15/2011   Procedure: COLONOSCOPY;  Surgeon: [name], MD;  [facility];  Service: Endoscopy;  Laterality: N/A;  930   COLONOSCOPY WITH PROPOFOL N/A 07/17/2021   Procedure: COLONOSCOPY WITH PROPOFOL;  Surgeon: [name], MD;  [facility];  Service: Endoscopy;  Laterality: N/A;  [place]  03/20/2008   EF>55%   LEFT HEART CATHETERIZATION WITH CORONARY ANGIOGRAM N/A 02/21/2011   Procedure: LEFT HEART CATHETERIZATION WITH CORONARY ANGIOGRAM;  Surgeon: [name], MD;  Location: [facility] CATH [facility];  Service: Cardiovascular;  Normal Coronary Arteries   NM MYOVIEW LTD  02/04/2011   EF 58%   POLYPECTOMY  07/17/2021   Procedure: POLYPECTOMY INTESTINAL;  Surgeon: [name], MD;  [facility];  Service: Endoscopy;;    Current Medications: Current Meds  Medication Sig   aspirin EC 81 MG tablet Take 1 tablet (81 mg total) by mouth daily.   atorvastatin (LIPITOR) 40 MG tablet Take 1 tablet by mouth daily.   hydrochlorothiazide (HYDRODIURIL) 12.5 MG tablet Take 12.5 mg by mouth daily.   lisinopril (ZESTRIL) 40 MG tablet TAKE 1 TABLET BY MOUTH EVERY DAY   metFORMIN (GLUCOPHAGE-XR) 500 MG 24 hr tablet Take 500 mg by mouth 2 (two) times daily.   pantoprazole (PROTONIX) 40 MG tablet Take 1 tablet (40 mg total) by mouth daily.   potassium chloride SA (KLOR-CON M)  20 MEQ tablet Take 10 mEq by mouth daily as needed (cramps).   PRESCRIPTION MEDICATION Apply 1 application. topically daily as needed (dry skin). Fluticasone 0.5% & ketoconazole 2%   vitamin B-12 (CYANOCOBALAMIN) 1000 MCG tablet Take 1,000 mcg by mouth daily.   [DISCONTINUED] metoprolol succinate (TOPROL-XL) 25 MG 24 hr tablet TAKE ONE-HALF TABLET BY MOUTH   DAILY     Allergies:   Patient has no active allergies.   Social History   Socioeconomic History   Marital status: Married    Spouse name: Not on file   Number of children: Not on file   Years of education: Not on file   Highest education level: Not on file  Occupational History   Not on file  Tobacco Use   Smoking status: Never   Smokeless tobacco: Never  Vaping Use   Vaping Use: Never used  Substance and Sexual Activity   Alcohol use: No   Drug use: No   Sexual activity: Yes    Partners: Female    Birth control/protection: None  Other Topics Concern   Not on file  Social History Narrative   Not on file   Social Determinants of Health   Financial Resource Strain: Not on file  Food Insecurity: Not on file  Transportation Needs: Not on file  Physical Activity: Not on file  Stress: Not on file  Social Connections: Not on file     Family History: The patient's family history is negative for Colon cancer.,  Aortic aneurysm/dissection or sudden death ROS:   Please see the history of present illness.    All other systems are reviewed and are negative.   EKGs/Labs/Other Studies Reviewed:    The following studies were reviewed today: Hospitalization records and CT angiography from October 2021 Notes from Dr. [name] October 2021  EKG:  EKG is not ordered today and personally reviewed.  It is very similar to previous tracings show normal sinus rhythm and an old right bundle branch block, no ischemic repolarization abnormalities.  QTc 495 ms  Recent Labs: 01/24/2019 Creatinine 1.02, potassium 3.8, hemoglobin A1c 7.0%, hemoglobin 13.4 BMET    Component Value Date/Time   NA 140 07/15/2021 1113   K 3.3 (L) 07/15/2021 1113   CL 106 07/15/2021 1113   CO2 25 07/15/2021 1113   GLUCOSE 161 (H) 07/15/2021 1113   BUN 16 07/15/2021 1113   CREATININE 1.10 07/15/2021 1113   CREATININE 1.04 04/23/2015 1014   CALCIUM 8.8 (L) 07/15/2021 1113   GFRNONAA >60 07/15/2021 1113      Recent Lipid Panel 01/24/2019 Cholesterol 116, HDL 34, LDL 64, triglycerides 94 Lipid Panel  No results found for: "CHOL", "TRIG", "HDL", "CHOLHDL", "VLDL", "LDLCALC", "LDLDIRECT", "LABVLDL"   Physical Exam:    VS:  BP (!) 142/87   Pulse 76   Ht '5\' 11"'$  (1.803 m)   Wt 187 lb (84.8 kg)   SpO2 97%   BMI 26.08 kg/m    recheck blood pressure by me was 170/88.  Wt Readings from Last 3 Encounters:  04/14/22 187 lb (84.8 kg)  03/17/22 181 lb (82.1 kg)  12/24/21 181 lb (82.1 kg)    General: Alert, oriented x3, no distress, he appears younger than stated age Head: no evidence of trauma, PERRL, EOMI, no exophtalmos or lid lag, no myxedema, no xanthelasma; normal ears, nose and oropharynx Neck: normal jugular venous pulsations and no hepatojugular reflux; brisk carotid pulses without delay and no carotid bruits Chest: clear to auscultation, no signs of  consolidation by percussion or palpation, normal fremitus, symmetrical and full respiratory excursions Cardiovascular: normal position and quality of the apical impulse, regular rhythm, normal first and second heart sounds, no murmurs, rubs or gallops Abdomen: no tenderness or distention, no masses by palpation, no abnormal pulsatility or arterial bruits, normal bowel sounds, no hepatosplenomegaly Extremities: no clubbing, cyanosis or edema; 2+ radial, ulnar and brachial pulses bilaterally; 2+ right femoral, posterior tibial and dorsalis pedis pulses; 2+ left femoral, posterior tibial and dorsalis pedis pulses; no subclavian or femoral bruits Neurological: grossly nonfocal Psych: Normal mood and affect   ASSESSMENT:    1. Aneurysm of ascending aorta without rupture ([place])   2. Essential hypertension   3. Hypercholesterolemia   4. Type 2 diabetes mellitus without complication, without long-term current use of insulin (HCC)      PLAN:    In order of problems listed above:  AATA: Stable in size since 2010 and asymptomatic, with  maximum diameter 4.2-4.3 cm, last measured on CT angiogram from October 2023 HTN: Continue lisinopril hydrochlorothiazide.  Increase the dose of beta-blocker. HLP: Followed by Dr. Karie Kirks.  On atorvastatin. DM: Has gained some weight.  Labs to be checked next month with Dr. Karie Kirks   Medication Adjustments/Labs and Tests Ordered: Current medicines are reviewed at length with the patient today.  Concerns regarding medicines are outlined above.  No orders of the defined types were placed in this encounter.  Meds ordered this encounter  Medications   metoprolol succinate (TOPROL-XL) 25 MG 24 hr tablet    Sig: Take 1 tablet (25 mg total) by mouth daily.    Dispense:  90 tablet    Refill:  3    Please send a replace/new response with 90-Day Supply if appropriate to maximize member benefit. Requesting 1 year supply.    Signed, [name], MD  04/15/2022 8:28 PM    [place]

## 2022-04-26 ENCOUNTER — Encounter: Payer: Self-pay | Admitting: Cardiovascular Disease

## 2022-06-03 ENCOUNTER — Encounter: Payer: Self-pay | Admitting: Cardiovascular Disease

## 2022-10-17 ENCOUNTER — Ambulatory Visit (HOSPITAL_COMMUNITY): Admission: RE | Admit: 2022-10-17 | Payer: Medicare Other | Source: Ambulatory Visit

## 2022-10-17 ENCOUNTER — Other Ambulatory Visit (HOSPITAL_COMMUNITY): Payer: Self-pay | Admitting: Adult Health Nurse Practitioner

## 2022-10-17 DIAGNOSIS — K4090 Unilateral inguinal hernia, without obstruction or gangrene, not specified as recurrent: Secondary | ICD-10-CM

## 2022-10-29 ENCOUNTER — Ambulatory Visit: Payer: Self-pay | Admitting: Surgery

## 2022-10-29 NOTE — H&P (Signed)
Brian Gray [id number]   Referring Provider:  Roe Rutherford, NP Cardiologist, Dr. Royann Shivers  Subjective   Chief Complaint: New Consultation and Inguinal Hernia     History of Present Illness:    Very pleasant 77 year old man with history of diabetes, hypertension, hyperlipidemia, GERD, stable ascending aortic thoracic aneurysm who presents for evaluation of a right inguinal hernia.  He began experiencing pain and swelling in the right groin earlier this month, he had an ultrasound a couple weeks ago showing a moderate-sized right inguinal hernia containing fat and bowel. This has been reducible.  He denies any obstructive symptoms, no urinary symptoms.  Denies any previous surgeries or hospitalizations in his entire life.   Review of Systems: A complete review of systems was obtained from the patient.  I have reviewed this information and discussed as appropriate with the patient.  See HPI as well for other ROS.   Medical History: Past Medical History:  Diagnosis Date   Aneurysm (CMS-HCC)    Diabetes mellitus without complication (CMS/HHS-HCC)    GERD (gastroesophageal reflux disease)    Hypertension     There is no problem list on file for this patient.   History reviewed. No pertinent surgical history.   No Known Allergies  Current Outpatient Medications on File Prior to Visit  Medication Sig Dispense Refill   aspirin 81 MG EC tablet Take 81 mg by mouth once daily     atorvastatin (LIPITOR) 40 MG tablet Take 1 tablet by mouth once daily     lisinopriL (ZESTRIL) 40 MG tablet Take 40 mg by mouth once daily     metFORMIN (GLUCOPHAGE-XR) 500 MG XR tablet Take 500 mg by mouth 2 (two) times daily     metoprolol succinate (TOPROL-XL) 25 MG XL tablet Take 25 mg by mouth once daily     pantoprazole (PROTONIX) 40 MG DR tablet Take 40 mg by mouth once daily     No current facility-administered medications on file prior to visit.    Family History  Problem Relation Age of  Onset   Skin cancer Brother    Obesity Brother    High blood pressure (Hypertension) Brother    Hyperlipidemia (Elevated cholesterol) Brother    Diabetes Brother    Breast cancer Brother      Social History   Tobacco Use  Smoking Status Never  Smokeless Tobacco Never     Social History   Socioeconomic History   Marital status: Married  Tobacco Use   Smoking status: Never   Smokeless tobacco: Never  Substance and Sexual Activity   Alcohol use: Not Currently   Drug use: Never   Social Determinants of Health   Financial Resource Strain: Low Risk  (10/15/2022)   Received from [facility]   Overall Financial Resource Strain (CARDIA)    Difficulty of Paying Living Expenses: Not hard at all  Food Insecurity: No Food Insecurity (10/15/2022)   Received from [facility]   Hunger Vital Sign    Worried About Running Out of Food in the Last Year: Never true    Ran Out of Food in the Last Year: Never true  Transportation Needs: No Transportation Needs (10/15/2022)   Received from [facility] - Transportation    Lack of Transportation (Medical): No    Lack of Transportation (Non-Medical): No  Physical Activity: Unknown (10/15/2022)   Received from [facility]   Exercise Vital Sign    Days of Exercise per Week: Patient declined  Minutes of Exercise per Session: 30 min  Stress: No Stress Concern Present (10/15/2022)   Received from [facility] - Occupational Stress Questionnaire    Feeling of Stress : Not at all  Social Connections: Socially Integrated (10/15/2022)   Received from [facility]   Social Network    How would you rate your social network (family, work, friends)?: Good participation with social networks    Objective:    Vitals:   10/29/22 1502  BP: (!) 174/84  Pulse: 89  Temp: 36.7 C (98 F)  SpO2: 97%  Weight: 78.9 kg (174 lb)  Height: 180.3 cm (5\' 11" )    Body mass index is 24.27 kg/m.  Gen:  A&Ox3, no distress  Unlabored respirations Abdomen soft and nontender Moderate to large reducible right inguinal hernia, no overlying skin changes or tenderness  Assessment and Plan:  Diagnoses and all orders for this visit:  Inguinal hernia without obstruction or gangrene, recurrence not specified, unspecified laterality    We discussed the relevant anatomy and we discussed options for repair.  I recommend an open approach and went over the technique of the procedure.  Discussed risks of bleeding, infection, pain, scarring, injury to structures in the area including nerves, blood vessels, bowel, bladder, risk of chronic pain, hernia recurrence, risk of seroma or hematoma, urinary retention, and risks of general anesthesia including cardiovascular, pulmonary, and thromboembolic complications.  We discussed typical postop recovery, timeline, and activity limitations.  We also discussed the option of ongoing observation, with high rate of ultimately returning for surgery and risk of increasing size/symptoms from the hernia as well as incarceration/strangulation and went over symptoms that should prompt him to seek emergency treatment.  Questions were come and answered to the patient's satisfaction. Patient wishes to proceed with scheduling.   Marvin Maenza Carlye Grippe, MD

## 2022-10-29 NOTE — H&P (View-Only) (Signed)
Brian Gray [id number]   Referring Provider:  Roe Rutherford, NP Cardiologist, Dr. Royann Shivers  Subjective   Chief Complaint: New Consultation and Inguinal Hernia     History of Present Illness:    Very pleasant 77 year old man with history of diabetes, hypertension, hyperlipidemia, GERD, stable ascending aortic thoracic aneurysm who presents for evaluation of a right inguinal hernia.  He began experiencing pain and swelling in the right groin earlier this month, he had an ultrasound a couple weeks ago showing a moderate-sized right inguinal hernia containing fat and bowel. This has been reducible.  He denies any obstructive symptoms, no urinary symptoms.  Denies any previous surgeries or hospitalizations in his entire life.   Review of Systems: A complete review of systems was obtained from the patient.  I have reviewed this information and discussed as appropriate with the patient.  See HPI as well for other ROS.   Medical History: Past Medical History:  Diagnosis Date   Aneurysm (CMS-HCC)    Diabetes mellitus without complication (CMS/HHS-HCC)    GERD (gastroesophageal reflux disease)    Hypertension     There is no problem list on file for this patient.   History reviewed. No pertinent surgical history.   No Known Allergies  Current Outpatient Medications on File Prior to Visit  Medication Sig Dispense Refill   aspirin 81 MG EC tablet Take 81 mg by mouth once daily     atorvastatin (LIPITOR) 40 MG tablet Take 1 tablet by mouth once daily     lisinopriL (ZESTRIL) 40 MG tablet Take 40 mg by mouth once daily     metFORMIN (GLUCOPHAGE-XR) 500 MG XR tablet Take 500 mg by mouth 2 (two) times daily     metoprolol succinate (TOPROL-XL) 25 MG XL tablet Take 25 mg by mouth once daily     pantoprazole (PROTONIX) 40 MG DR tablet Take 40 mg by mouth once daily     No current facility-administered medications on file prior to visit.    Family History  Problem Relation Age of  Onset   Skin cancer Brother    Obesity Brother    High blood pressure (Hypertension) Brother    Hyperlipidemia (Elevated cholesterol) Brother    Diabetes Brother    Breast cancer Brother      Social History   Tobacco Use  Smoking Status Never  Smokeless Tobacco Never     Social History   Socioeconomic History   Marital status: Married  Tobacco Use   Smoking status: Never   Smokeless tobacco: Never  Substance and Sexual Activity   Alcohol use: Not Currently   Drug use: Never   Social Determinants of Health   Financial Resource Strain: Low Risk  (10/15/2022)   Received from [facility]   Overall Financial Resource Strain (CARDIA)    Difficulty of Paying Living Expenses: Not hard at all  Food Insecurity: No Food Insecurity (10/15/2022)   Received from [facility]   Hunger Vital Sign    Worried About Running Out of Food in the Last Year: Never true    Ran Out of Food in the Last Year: Never true  Transportation Needs: No Transportation Needs (10/15/2022)   Received from [facility] - Transportation    Lack of Transportation (Medical): No    Lack of Transportation (Non-Medical): No  Physical Activity: Unknown (10/15/2022)   Received from [facility]   Exercise Vital Sign    Days of Exercise per Week: Patient declined  Minutes of Exercise per Session: 30 min  Stress: No Stress Concern Present (10/15/2022)   Received from [facility] - Occupational Stress Questionnaire    Feeling of Stress : Not at all  Social Connections: Socially Integrated (10/15/2022)   Received from [facility] ([facility]   Social Network    How would you rate your social network (family, work, friends)?: Good participation with social networks    Objective:    Vitals:   10/29/22 1502  BP: (!) 174/84  Pulse: 89  Temp: 36.7 C (98 F)  SpO2: 97%  Weight: 78.9 kg (174 lb)  Height: 180.3 cm (5\' 11" )    Body mass index is 24.27 kg/m.  Gen:  A&Ox3, no distress  Unlabored respirations Abdomen soft and nontender Moderate to large reducible right inguinal hernia, no overlying skin changes or tenderness  Assessment and Plan:  Diagnoses and all orders for this visit:  Inguinal hernia without obstruction or gangrene, recurrence not specified, unspecified laterality    We discussed the relevant anatomy and we discussed options for repair.  I recommend an open approach and went over the technique of the procedure.  Discussed risks of bleeding, infection, pain, scarring, injury to structures in the area including nerves, blood vessels, bowel, bladder, risk of chronic pain, hernia recurrence, risk of seroma or hematoma, urinary retention, and risks of general anesthesia including cardiovascular, pulmonary, and thromboembolic complications.  We discussed typical postop recovery, timeline, and activity limitations.  We also discussed the option of ongoing observation, with high rate of ultimately returning for surgery and risk of increasing size/symptoms from the hernia as well as incarceration/strangulation and went over symptoms that should prompt him to seek emergency treatment.  Questions were come and answered to the patient's satisfaction. Patient wishes to proceed with scheduling.   Marvin Maenza Carlye Grippe, MD

## 2022-11-10 ENCOUNTER — Encounter: Payer: Self-pay | Admitting: Cardiovascular Disease

## 2022-11-17 ENCOUNTER — Other Ambulatory Visit: Payer: Self-pay | Admitting: Thoracic Surgery (Cardiothoracic Vascular Surgery)

## 2022-11-17 DIAGNOSIS — I77810 Thoracic aortic ectasia: Secondary | ICD-10-CM

## 2022-11-17 NOTE — Progress Notes (Signed)
COVID Vaccine Completed:  Date of COVID positive in last 90 days:  PCP - Gareth Morgan, MD Cardiologist - Thurmon Fair, MD LOV 04/14/22  Chest x-ray - CT 12/24/21 Epic EKG - 03/17/22 Epic RBBB Stress Test - 02/05/11 Epic ECHO - 01/04/21 Epic Cardiac Cath - 02/21/11 Pacemaker/ICD device last checked: Spinal Cord Stimulator:  Bowel Prep -   Sleep Study -  CPAP -   Fasting Blood Sugar -  Checks Blood Sugar _____ times a day  Last dose of GLP1 agonist-  N/A GLP1 instructions:  N/A   Last dose of SGLT-2 inhibitors-  N/A SGLT-2 instructions: N/A   Blood Thinner Instructions:  Time Aspirin Instructions: ASA 81, hold 5-7 days Last Dose:  Activity level:  Can go up a flight of stairs and perform activities of daily living without stopping and without symptoms of chest pain or shortness of breath.  Able to exercise without symptoms  Unable to go up a flight of stairs without symptoms of     Anesthesia review: RBBB, HTN, aortic valve insufficiency, DM2  Patient denies shortness of breath, fever, cough and chest pain at PAT appointment  Patient verbalized understanding of instructions that were given to them at the PAT appointment. Patient was also instructed that they will need to review over the PAT instructions again at home before surgery.

## 2022-11-17 NOTE — Patient Instructions (Signed)
SURGICAL WAITING ROOM VISITATION  Patients having surgery or a procedure may have no more than 2 support people in the waiting area - these visitors may rotate.    Children under the age of 7 must have an adult with them who is not the patient.  Due to an increase in RSV and influenza rates and associated hospitalizations, children ages 66 and under may not visit patients in [facility] hospitals.  If the patient needs to stay at the hospital during part of their recovery, the visitor guidelines for inpatient rooms apply. Pre-op nurse will coordinate an appropriate time for 1 support person to accompany patient in pre-op.  This support person may not rotate.    Please refer to the [facility] website for the visitor guidelines for Inpatients (after your surgery is over and you are in a regular room).    Your procedure is scheduled on: 11/20/22   Report to [facility] Main Entrance    Report to admitting at 8:45 AM   Call this number if you have problems the morning of surgery [phone]   Do not eat food or drink liquids :After Midnight.          If you have questions, please contact your surgeon's office.   FOLLOW BOWEL PREP AND ANY ADDITIONAL PRE OP INSTRUCTIONS YOU RECEIVED FROM YOUR SURGEON'S OFFICE!!!     Oral Hygiene is also important to reduce your risk of infection.                                    Remember - BRUSH YOUR TEETH THE MORNING OF SURGERY WITH YOUR REGULAR TOOTHPASTE  DENTURES WILL BE REMOVED PRIOR TO SURGERY PLEASE DO NOT APPLY "Poly grip" OR ADHESIVES!!!   Stop all vitamins and herbal supplements 7 days before surgery.   Take these medicines the morning of surgery with A SIP OF WATER: Atorvastatin, Metoprolol, Pantoprazole   DO NOT TAKE ANY ORAL DIABETIC MEDICATIONS DAY OF YOUR SURGERY  How to Manage Your Diabetes Before and After Surgery  Why is it important to control my blood sugar before and after surgery? Improving blood sugar levels  before and after surgery helps healing and can limit problems. A way of improving blood sugar control is eating a healthy diet by:  Eating less sugar and carbohydrates  Increasing activity/exercise  Talking with your doctor about reaching your blood sugar goals High blood sugars (greater than 180 mg/dL) can raise your risk of infections and slow your recovery, so you will need to focus on controlling your diabetes during the weeks before surgery. Make sure that the doctor who takes care of your diabetes knows about your planned surgery including the date and location.  How do I manage my blood sugar before surgery? Check your blood sugar at least 4 times a day, starting 2 days before surgery, to make sure that the level is not too high or low. Check your blood sugar the morning of your surgery when you wake up and every 2 hours until you get to the Short Stay unit. If your blood sugar is less than 70 mg/dL, you will need to treat for low blood sugar: Do not take insulin. Treat a low blood sugar (less than 70 mg/dL) with  cup of clear juice (cranberry or apple), 4 glucose tablets, OR glucose gel. Recheck blood sugar in 15 minutes after treatment (to make sure it is  greater than 70 mg/dL). If your blood sugar is not greater than 70 mg/dL on recheck, call [phone] for further instructions. Report your blood sugar to the short stay nurse when you get to Short Stay.  If you are admitted to the hospital after surgery: Your blood sugar will be checked by the staff and you will probably be given insulin after surgery (instead of oral diabetes medicines) to make sure you have good blood sugar levels. The goal for blood sugar control after surgery is 80-180 mg/dL.   WHAT DO I DO ABOUT MY DIABETES MEDICATION?  Do not take oral diabetes medicines (pills) the morning of surgery.  THE DAY BEFORE SURGERY, take Metformin as prescribed.      THE MORNING OF SURGERY, do not take Metformin.   Reviewed  and Endorsed by [facility] Patient Education Committee, August 2015                              You may not have any metal on your body including jewelry, and body piercing             Do not wear lotions, powders, cologne, or deodorant              Men may shave face and neck.   Do not bring valuables to the hospital. [place] [facility] IS NOT             RESPONSIBLE   FOR VALUABLES.   Contacts, glasses, dentures or bridgework may not be worn into surgery.  DO NOT BRING YOUR HOME MEDICATIONS TO THE HOSPITAL. PHARMACY WILL DISPENSE MEDICATIONS LISTED ON YOUR MEDICATION LIST TO YOU DURING YOUR ADMISSION IN THE HOSPITAL!    Patients discharged on the day of surgery will not be allowed to drive home.  Someone NEEDS to stay with you for the first 24 hours after anesthesia.   Special Instructions: Bring a copy of your healthcare power of attorney and living will documents the day of surgery if you haven't scanned them before.              Please read over the following fact sheets you were given: IF YOU HAVE QUESTIONS ABOUT YOUR PRE-OP INSTRUCTIONS PLEASE CALL [phone] [place]ras    If you received a COVID test during your pre-op visit  it is requested that you wear a mask when out in public, stay away from anyone that may not be feeling well and notify your surgeon if you develop symptoms. If you test positive for Covid or have been in contact with anyone that has tested positive in the last 10 days please notify you surgeon.     - Preparing for Surgery Before surgery, you can play an important role.  Because skin is not sterile, your skin needs to be as free of germs as possible.  You can reduce the number of germs on your skin by washing with CHG (chlorahexidine gluconate) soap before surgery.  CHG is an antiseptic cleaner which kills germs and bonds with the skin to continue killing germs even after washing. Please DO NOT use if you have an allergy to CHG or antibacterial soaps.  If  your skin becomes reddened/irritated stop using the CHG and inform your nurse when you arrive at Short Stay. Do not shave (including legs and underarms) for at least 48 hours prior to the first CHG shower.  You may shave your face/neck.  Please follow these  instructions carefully:  1.  Shower with CHG Soap the night before surgery and the  morning of surgery.  2.  If you choose to wash your hair, wash your hair first as usual with your normal  shampoo.  3.  After you shampoo, rinse your hair and body thoroughly to remove the shampoo.                             4.  Use CHG as you would any other liquid soap.  You can apply chg directly to the skin and wash.  Gently with a scrungie or clean washcloth.  5.  Apply the CHG Soap to your body ONLY FROM THE NECK DOWN.   Do   not use on face/ open                           Wound or open sores. Avoid contact with eyes, ears mouth and   genitals (private parts).                       Wash face,  Genitals (private parts) with your normal soap.             6.  Wash thoroughly, paying special attention to the area where your    surgery  will be performed.  7.  Thoroughly rinse your body with warm water from the neck down.  8.  DO NOT shower/wash with your normal soap after using and rinsing off the CHG Soap.                9.  Pat yourself dry with a clean towel.            10.  Wear clean pajamas.            11.  Place clean sheets on your bed the night of your first shower and do not  sleep with pets. Day of Surgery : Do not apply any lotions/deodorants the morning of surgery.  Please wear clean clothes to the hospital/surgery center.  FAILURE TO FOLLOW THESE INSTRUCTIONS MAY RESULT IN THE CANCELLATION OF YOUR SURGERY  PATIENT SIGNATURE_________________________________  NURSE SIGNATURE__________________________________  ________________________________________________________________________

## 2022-11-18 ENCOUNTER — Encounter (HOSPITAL_COMMUNITY): Payer: Self-pay

## 2022-11-18 ENCOUNTER — Encounter (HOSPITAL_COMMUNITY)
Admission: RE | Admit: 2022-11-18 | Discharge: 2022-11-18 | Disposition: A | Discharge status: Home / Self Care | Payer: Medicare Other | Source: Ambulatory Visit | Attending: Surgery | Admitting: Surgery

## 2022-11-18 ENCOUNTER — Other Ambulatory Visit: Payer: Self-pay

## 2022-11-18 VITALS — BP 134/84 | HR 82 | Temp 98.7°F | Resp 18 | Ht 71.0 in | Wt 169.0 lb

## 2022-11-18 DIAGNOSIS — Z01812 Encounter for preprocedural laboratory examination: Secondary | ICD-10-CM | POA: Insufficient documentation

## 2022-11-18 DIAGNOSIS — I10 Essential (primary) hypertension: Secondary | ICD-10-CM | POA: Insufficient documentation

## 2022-11-18 DIAGNOSIS — E119 Type 2 diabetes mellitus without complications: Secondary | ICD-10-CM | POA: Insufficient documentation

## 2022-11-18 DIAGNOSIS — K4090 Unilateral inguinal hernia, without obstruction or gangrene, not specified as recurrent: Secondary | ICD-10-CM | POA: Insufficient documentation

## 2022-11-18 HISTORY — DX: Pneumonia, unspecified organism: J18.9

## 2022-11-18 LAB — CBC
HCT: 42.9 % (ref 39.0–52.0)
Hemoglobin: 13.6 g/dL (ref 13.0–17.0)
MCH: 30.2 pg (ref 26.0–34.0)
MCHC: 31.7 g/dL (ref 30.0–36.0)
MCV: 95.3 fL (ref 80.0–100.0)
Platelets: 235 10*3/uL (ref 150–400)
RBC: 4.5 MIL/uL (ref 4.22–5.81)
RDW: 12.5 % (ref 11.5–15.5)
WBC: 9.2 10*3/uL (ref 4.0–10.5)
nRBC: 0 % (ref 0.0–0.2)

## 2022-11-18 LAB — BASIC METABOLIC PANEL
Anion gap: 8 (ref 5–15)
BUN: 21 mg/dL (ref 8–23)
CO2: 27 mmol/L (ref 22–32)
Calcium: 8.9 mg/dL (ref 8.9–10.3)
Chloride: 101 mmol/L (ref 98–111)
Creatinine, Ser: 0.99 mg/dL (ref 0.61–1.24)
GFR, Estimated: >60 mL/min (ref >60)
Glucose, Bld: 133 mg/dL — ABNORMAL HIGH (ref 70–99)
Potassium: 3.5 mmol/L (ref 3.5–5.1)
Sodium: 136 mmol/L (ref 135–145)

## 2022-11-18 LAB — GLUCOSE, CAPILLARY: Glucose-Capillary: 121 mg/dL — ABNORMAL HIGH (ref 70–99)

## 2022-11-19 LAB — HEMOGLOBIN A1C
Hgb A1c MFr Bld: 6.9 % — ABNORMAL HIGH (ref 4.8–5.6)
Mean Plasma Glucose: 151 mg/dL

## 2022-11-19 NOTE — Anesthesia Preprocedure Evaluation (Addendum)
Anesthesia Evaluation  Patient identified by MRN, date of birth, ID band Patient awake    Reviewed: Allergy & Precautions, NPO status , Patient's Chart, lab work & pertinent test results  History of Anesthesia Complications Negative for: history of anesthetic complications  Airway Mallampati: II  TM Distance: >3 FB Neck ROM: Full    Dental  (+) Dental Advisory Given   Pulmonary neg pulmonary ROS   Pulmonary exam normal breath sounds clear to auscultation       Cardiovascular hypertension (HCTZ, lisinopril, metoprolol), Pt. on medications and Pt. on home beta blockers (-) angina (-) Past MI, (-) Cardiac Stents and (-) CABG + dysrhythmias (RBBB) + Valvular Problems/Murmurs (mild) AI and MR  Rhythm:Regular Rate:Normal  HLD, ascending aorta dilation  TTE 01/04/2021: IMPRESSIONS     1. Normal LV function; moderately dilated ascending aorta (4.6 cm);  trileaflet aortic valve with nodular thickening of noncoronary cusp; mild  AI. Suggest CTA to further assess thoracic aorta.   2. Left ventricular ejection fraction, by estimation, is 55 to 60%. The  left ventricle has normal function. The left ventricle has no regional  wall motion abnormalities. Left ventricular diastolic parameters are  consistent with Grade I diastolic  dysfunction (impaired relaxation).   3. Right ventricular systolic function is normal. The right ventricular  size is normal.   4. The mitral valve is normal in structure. Mild mitral valve  regurgitation. No evidence of mitral stenosis.   5. The aortic valve is tricuspid. Aortic valve regurgitation is mild.  Mild aortic valve sclerosis is present, with no evidence of aortic valve  stenosis.   6. Aortic dilatation noted. There is mild dilatation of the aortic root,  measuring 41 mm. There is moderate dilatation of the ascending aorta,  measuring 46 mm.     Neuro/Psych neg Seizures  Neuromuscular disease  (neuropathy)    GI/Hepatic Neg liver ROS,GERD  Medicated,,  Endo/Other  diabetes (Hgb A1c 6.9), Well Controlled, Type 2, Oral Hypoglycemic Agents    Renal/GU negative Renal ROS     Musculoskeletal   Abdominal   Peds  Hematology negative hematology ROS (+) Lab Results      Component                Value               Date                      WBC                      9.2                 11/18/2022                HGB                      13.6                11/18/2022                HCT                      42.9                11/18/2022                MCV  95.3                11/18/2022                PLT                      235                 11/18/2022              Anesthesia Other Findings   Reproductive/Obstetrics                             Anesthesia Physical Anesthesia Plan  ASA: 2  Anesthesia Plan: General   Post-op Pain Management: Tylenol PO (pre-op)*   Induction: Intravenous  PONV Risk Score and Plan: 2 and Ondansetron and Dexamethasone  Airway Management Planned: Oral ETT  Additional Equipment:   Intra-op Plan:   Post-operative Plan: Extubation in OR  Informed Consent: I have reviewed the patients History and Physical, chart, labs and discussed the procedure including the risks, benefits and alternatives for the proposed anesthesia with the patient or authorized representative who has indicated his/her understanding and acceptance.     Dental advisory given  Plan Discussed with: CRNA and Anesthesiologist  Anesthesia Plan Comments: (See PAT note 11/18/2022  Risks of general anesthesia discussed including, but not limited to, sore throat, hoarse voice, chipped/damaged teeth, injury to vocal cords, nausea and vomiting, allergic reactions, lung infection, heart attack, stroke, and death. All questions answered. )       Anesthesia Quick Evaluation

## 2022-11-19 NOTE — Progress Notes (Signed)
Anesthesia Chart Review   Case: [id number] Date/Time: 11/20/22 1045   Procedure: OPEN RIGHT HERNIA REPAIR INGUINAL WITH MESH (Right)   Anesthesia type: General   Pre-op diagnosis: INGUINAL HERNIA   Location: [facility]OM 04 / [facility]   Surgeons: Berna Bue, MD       DISCUSSION:77 y.o. 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 Phylliss Blakes.   Pt last seen by cardiology 04/14/2022. Stable at this visit.  Unchanged size of ascending aorta, 4.2 cm on CT 02/23/22.   VS: BP 134/84   Pulse 82   Temp 37.1 C (Oral)   Resp 18   Ht 5\' 11"  (1.803 m)   Wt 76.7 kg   SpO2 97%   BMI 23.57 kg/m   PROVIDERS: Roe Rutherford, NP is PCP   Croitoru, Rachelle Hora, MD is Cardiologist  LABS: Labs reviewed: Acceptable for surgery. (all labs ordered are listed, but only abnormal results are displayed)  Labs Reviewed  HEMOGLOBIN A1C - Abnormal; Notable for the following components:      Result Value   Hgb A1c MFr Bld 6.9 (*)    All other components within normal limits  BASIC METABOLIC PANEL - Abnormal; Notable for the following components:   Glucose, Bld 133 (*)    All other components within normal limits  GLUCOSE, CAPILLARY - Abnormal; Notable for the following components:   Glucose-Capillary 121 (*)    All other components within normal limits  CBC     IMAGES: CT Angio Chest 12/24/2021 IMPRESSION: Essentially unchanged size and configuration of the ascending aorta, estimated 4.2 cm on the current CT angiogram.   Coronary artery disease and minimal aortic atherosclerosis. Aortic Atherosclerosis (ICD10-I70.0).   Additional ancillary findings as above.  EKG:   CV: Echo 01/04/2021 1. Normal LV function; moderately dilated ascending aorta (4.6 cm);  trileaflet aortic valve with nodular thickening of noncoronary cusp; mild  AI.  Suggest CTA to further assess thoracic aorta.   2. Left ventricular ejection fraction, by estimation, is 55 to 60%. The  left ventricle has normal function. The left ventricle has no regional  wall motion abnormalities. Left ventricular diastolic parameters are  consistent with Grade I diastolic  dysfunction (impaired relaxation).   3. Right ventricular systolic function is normal. The right ventricular  size is normal.   4. The mitral valve is normal in structure. Mild mitral valve  regurgitation. No evidence of mitral stenosis.   5. The aortic valve is tricuspid. Aortic valve regurgitation is mild.  Mild aortic valve sclerosis is present, with no evidence of aortic valve  stenosis.   6. Aortic dilatation noted. There is mild dilatation of the aortic root,  measuring 41 mm. There is moderate dilatation of the ascending aorta,  measuring 46 mm.   Past Medical History:  Diagnosis Date   Diabetes mellitus without complication (HCC)    GERD (gastroesophageal reflux disease)    Hyperlipidemia 06/10/2018   Hypertension 06/10/2018   Pneumonia    as child    Past Surgical History:  Procedure Laterality Date   COLONOSCOPY  05/15/2011   Procedure: COLONOSCOPY;  Surgeon: Malissa Hippo, MD;  [facility];  Service: Endoscopy;  Laterality: N/A;  930   COLONOSCOPY WITH PROPOFOL N/A 07/17/2021   Procedure: COLONOSCOPY WITH PROPOFOL;  Surgeon: Malissa Hippo, MD;  [facility];  Service: Endoscopy;  Laterality: N/A;  730   DOPPLER ECHOCARDIOGRAPHY  03/20/2008   EF>55%   LEFT HEART CATHETERIZATION WITH CORONARY ANGIOGRAM N/A 02/21/2011   Procedure: LEFT HEART CATHETERIZATION WITH CORONARY ANGIOGRAM;  Surgeon: Thurmon Fair, MD;  Location: [facility];  Service: Cardiovascular;  Normal Coronary Arteries   NM MYOVIEW LTD  02/04/2011   EF 58%   POLYPECTOMY  07/17/2021   Procedure: POLYPECTOMY INTESTINAL;  Surgeon: Malissa Hippo, MD;  [facility];   Service: Endoscopy;;    MEDICATIONS:  celecoxib (CELEBREX) 100 MG capsule   aspirin EC 81 MG tablet   atorvastatin (LIPITOR) 40 MG tablet   hydrochlorothiazide (HYDRODIURIL) 12.5 MG tablet   lisinopril (ZESTRIL) 40 MG tablet   metFORMIN (GLUCOPHAGE-XR) 500 MG 24 hr tablet   metoprolol succinate (TOPROL-XL) 25 MG 24 hr tablet   naproxen sodium (ANAPROX) 220 MG tablet   pantoprazole (PROTONIX) 40 MG tablet   potassium chloride (KLOR-CON M) 10 MEQ tablet   PRESCRIPTION MEDICATION   sildenafil (VIAGRA) 50 MG tablet   No current facility-administered medications for this encounter.     Jodell Cipro Ward, PA-C [facility] [phone]

## 2022-11-20 ENCOUNTER — Ambulatory Visit (HOSPITAL_BASED_OUTPATIENT_CLINIC_OR_DEPARTMENT_OTHER): Payer: Medicare Other

## 2022-11-20 ENCOUNTER — Encounter (HOSPITAL_COMMUNITY)
Admission: RE | Disposition: A | Discharge status: Home / Self Care | Payer: Self-pay | Source: Home / Self Care | Attending: Surgery

## 2022-11-20 ENCOUNTER — Encounter (HOSPITAL_COMMUNITY): Payer: Self-pay | Admitting: Surgery

## 2022-11-20 ENCOUNTER — Ambulatory Visit (HOSPITAL_COMMUNITY): Payer: Medicare Other

## 2022-11-20 ENCOUNTER — Ambulatory Visit (HOSPITAL_COMMUNITY)
Admission: RE | Admit: 2022-11-20 | Discharge: 2022-11-20 | Disposition: A | Discharge status: Home / Self Care | Payer: Medicare Other | Attending: Surgery | Admitting: Surgery

## 2022-11-20 ENCOUNTER — Other Ambulatory Visit: Payer: Self-pay

## 2022-11-20 DIAGNOSIS — K4090 Unilateral inguinal hernia, without obstruction or gangrene, not specified as recurrent: Secondary | ICD-10-CM | POA: Insufficient documentation

## 2022-11-20 DIAGNOSIS — I10 Essential (primary) hypertension: Secondary | ICD-10-CM | POA: Insufficient documentation

## 2022-11-20 DIAGNOSIS — Z7984 Long term (current) use of oral hypoglycemic drugs: Secondary | ICD-10-CM

## 2022-11-20 DIAGNOSIS — E785 Hyperlipidemia, unspecified: Secondary | ICD-10-CM | POA: Insufficient documentation

## 2022-11-20 DIAGNOSIS — G629 Polyneuropathy, unspecified: Secondary | ICD-10-CM | POA: Diagnosis not present

## 2022-11-20 DIAGNOSIS — K219 Gastro-esophageal reflux disease without esophagitis: Secondary | ICD-10-CM | POA: Diagnosis not present

## 2022-11-20 DIAGNOSIS — Z79899 Other long term (current) drug therapy: Secondary | ICD-10-CM | POA: Insufficient documentation

## 2022-11-20 DIAGNOSIS — Z8679 Personal history of other diseases of the circulatory system: Secondary | ICD-10-CM | POA: Diagnosis not present

## 2022-11-20 DIAGNOSIS — E119 Type 2 diabetes mellitus without complications: Secondary | ICD-10-CM | POA: Insufficient documentation

## 2022-11-20 HISTORY — PX: INGUINAL HERNIA REPAIR: SHX194

## 2022-11-20 LAB — GLUCOSE, CAPILLARY
Glucose-Capillary: 114 mg/dL — ABNORMAL HIGH (ref 70–99)
Glucose-Capillary: 134 mg/dL — ABNORMAL HIGH (ref 70–99)

## 2022-11-20 SURGERY — REPAIR, HERNIA, INGUINAL, ADULT
Anesthesia: General | Laterality: Right

## 2022-11-20 MED ORDER — CEFAZOLIN SODIUM-DEXTROSE 2-4 GM/100ML-% IV SOLN
INTRAVENOUS | Status: AC
  Filled 2022-11-20: qty 100

## 2022-11-20 MED ORDER — PROPOFOL 10 MG/ML IV BOLUS
INTRAVENOUS | Status: DC | PRN
  Administered 2022-11-20: 150 mg via INTRAVENOUS

## 2022-11-20 MED ORDER — MIDAZOLAM HCL 2 MG/2ML IJ SOLN
INTRAMUSCULAR | Status: AC
  Filled 2022-11-20: qty 2

## 2022-11-20 MED ORDER — BUPIVACAINE LIPOSOME 1.3 % IJ SUSP: 20.0000 mL | Freq: Once | INTRAMUSCULAR | Status: DC

## 2022-11-20 MED ORDER — FENTANYL CITRATE PF 50 MCG/ML IJ SOSY: 25.0000 ug | PREFILLED_SYRINGE | INTRAMUSCULAR | Status: DC | PRN

## 2022-11-20 MED ORDER — FENTANYL CITRATE (PF) 100 MCG/2ML IJ SOLN
INTRAMUSCULAR | Status: AC
  Filled 2022-11-20: qty 2

## 2022-11-20 MED ORDER — ACETAMINOPHEN 500 MG PO TABS
ORAL_TABLET | ORAL | Status: AC
  Administered 2022-11-20: 1000 mg via ORAL
  Filled 2022-11-20: qty 2

## 2022-11-20 MED ORDER — DEXAMETHASONE SODIUM PHOSPHATE 10 MG/ML IJ SOLN
INTRAMUSCULAR | Status: AC
  Filled 2022-11-20: qty 1

## 2022-11-20 MED ORDER — SUGAMMADEX SODIUM 200 MG/2ML IV SOLN
INTRAVENOUS | Status: DC | PRN
  Administered 2022-11-20: 180 mg via INTRAVENOUS

## 2022-11-20 MED ORDER — INSULIN ASPART 100 UNIT/ML IJ SOLN: 0.0000 [IU] | INTRAMUSCULAR | Status: DC | PRN

## 2022-11-20 MED ORDER — OXYCODONE HCL 5 MG PO TABS: 5.0000 mg | ORAL_TABLET | Freq: Once | ORAL | Status: DC | PRN

## 2022-11-20 MED ORDER — ACETAMINOPHEN 500 MG PO TABS: 1000.0000 mg | ORAL_TABLET | Freq: Once | ORAL | Status: AC

## 2022-11-20 MED ORDER — DOCUSATE SODIUM 100 MG PO CAPS: 100.0000 mg | ORAL_CAPSULE | Freq: Two times a day (BID) | ORAL | 0 refills | Status: AC

## 2022-11-20 MED ORDER — ORAL CARE MOUTH RINSE: 15.0000 mL | Freq: Once | OROMUCOSAL | Status: AC

## 2022-11-20 MED ORDER — BUPIVACAINE LIPOSOME 1.3 % IJ SUSP
INTRAMUSCULAR | Status: AC
  Filled 2022-11-20: qty 20

## 2022-11-20 MED ORDER — 0.9 % SODIUM CHLORIDE (POUR BTL) OPTIME
TOPICAL | Status: DC | PRN
Start: 2022-11-20 — End: 2022-11-20
  Administered 2022-11-20: 1000 mL

## 2022-11-20 MED ORDER — LIDOCAINE HCL (PF) 2 % IJ SOLN
INTRAMUSCULAR | Status: AC
  Filled 2022-11-20: qty 5

## 2022-11-20 MED ORDER — OXYCODONE HCL 5 MG PO TABS
5.0000 mg | ORAL_TABLET | Freq: Three times a day (TID) | ORAL | 0 refills | Status: AC | PRN
Start: 2022-11-20 — End: 2022-11-25

## 2022-11-20 MED ORDER — DEXAMETHASONE SODIUM PHOSPHATE 10 MG/ML IJ SOLN
INTRAMUSCULAR | Status: DC | PRN
  Administered 2022-11-20: 8 mg via INTRAVENOUS

## 2022-11-20 MED ORDER — ONDANSETRON HCL 4 MG/2ML IJ SOLN
INTRAMUSCULAR | Status: DC | PRN
  Administered 2022-11-20: 4 mg via INTRAVENOUS

## 2022-11-20 MED ORDER — ACETAMINOPHEN 500 MG PO TABS: 1000.0000 mg | ORAL_TABLET | ORAL | Status: DC

## 2022-11-20 MED ORDER — LACTATED RINGERS IV SOLN: INTRAVENOUS | Status: DC

## 2022-11-20 MED ORDER — AMISULPRIDE (ANTIEMETIC) 5 MG/2ML IV SOLN: 10.0000 mg | Freq: Once | INTRAVENOUS | Status: DC | PRN

## 2022-11-20 MED ORDER — BUPIVACAINE LIPOSOME 1.3 % IJ SUSP
INTRAMUSCULAR | Status: DC | PRN
  Administered 2022-11-20: 20 mL

## 2022-11-20 MED ORDER — CHLORHEXIDINE GLUCONATE 0.12 % MT SOLN
15.0000 mL | Freq: Once | OROMUCOSAL | Status: AC
  Administered 2022-11-20: 15 mL via OROMUCOSAL

## 2022-11-20 MED ORDER — LIDOCAINE HCL (CARDIAC) PF 100 MG/5ML IV SOSY
PREFILLED_SYRINGE | INTRAVENOUS | Status: DC | PRN
  Administered 2022-11-20: 50 mg via INTRAVENOUS

## 2022-11-20 MED ORDER — ONDANSETRON HCL 4 MG/2ML IJ SOLN
INTRAMUSCULAR | Status: AC
  Filled 2022-11-20: qty 2

## 2022-11-20 MED ORDER — BUPIVACAINE-EPINEPHRINE 0.25% -1:200000 IJ SOLN
INTRAMUSCULAR | Status: AC
  Filled 2022-11-20: qty 1

## 2022-11-20 MED ORDER — FENTANYL CITRATE (PF) 100 MCG/2ML IJ SOLN
INTRAMUSCULAR | Status: DC | PRN
  Administered 2022-11-20 (×4): 50 ug via INTRAVENOUS

## 2022-11-20 MED ORDER — OXYCODONE HCL 5 MG/5ML PO SOLN: 5.0000 mg | Freq: Once | ORAL | Status: DC | PRN

## 2022-11-20 MED ORDER — EPHEDRINE SULFATE (PRESSORS) 50 MG/ML IJ SOLN
INTRAMUSCULAR | Status: DC | PRN
  Administered 2022-11-20 (×2): 5 mg via INTRAVENOUS

## 2022-11-20 MED ORDER — BUPIVACAINE-EPINEPHRINE 0.25% -1:200000 IJ SOLN
INTRAMUSCULAR | Status: DC | PRN
  Administered 2022-11-20: 50 mL

## 2022-11-20 MED ORDER — CHLORHEXIDINE GLUCONATE 4 % EX SOLN: 60.0000 mL | Freq: Once | CUTANEOUS | Status: DC

## 2022-11-20 MED ORDER — ROCURONIUM BROMIDE 100 MG/10ML IV SOLN
INTRAVENOUS | Status: DC | PRN
  Administered 2022-11-20: 50 mg via INTRAVENOUS

## 2022-11-20 MED ORDER — CEFAZOLIN SODIUM-DEXTROSE 2-4 GM/100ML-% IV SOLN
2.0000 g | INTRAVENOUS | Status: AC
  Administered 2022-11-20: 2 g via INTRAVENOUS

## 2022-11-20 MED ORDER — PROPOFOL 10 MG/ML IV BOLUS
INTRAVENOUS | Status: AC
  Filled 2022-11-20: qty 20

## 2022-11-20 MED ORDER — ROCURONIUM BROMIDE 10 MG/ML (PF) SYRINGE
PREFILLED_SYRINGE | INTRAVENOUS | Status: AC
  Filled 2022-11-20: qty 10

## 2022-11-20 SURGICAL SUPPLY — 38 items
APL PRP STRL LF DISP 70% ISPRP (MISCELLANEOUS) ×1
APL SKNCLS STERI-STRIP NONHPOA (GAUZE/BANDAGES/DRESSINGS) ×1
BAG COUNTER SPONGE SURGICOUNT (BAG) IMPLANT
BAG SPNG CNTER NS LX DISP (BAG)
BENZOIN TINCTURE PRP APPL 2/3 (GAUZE/BANDAGES/DRESSINGS) ×1 IMPLANT
BLADE SURG 15 STRL LF DISP TIS (BLADE) ×1 IMPLANT
BLADE SURG 15 STRL SS (BLADE) ×1
CHLORAPREP W/TINT 26 (MISCELLANEOUS) ×1 IMPLANT
CLSR STERI-STRIP ANTIMIC 1/2X4 (GAUZE/BANDAGES/DRESSINGS) IMPLANT
COVER SURGICAL LIGHT HANDLE (MISCELLANEOUS) ×1 IMPLANT
DRAIN PENROSE 0.5X18 (DRAIN) ×1 IMPLANT
DRAPE LAPAROSCOPIC ABDOMINAL (DRAPES) ×1 IMPLANT
ELECT REM PT RETURN 15FT ADLT (MISCELLANEOUS) ×1 IMPLANT
GAUZE SPONGE 4X4 12PLY STRL (GAUZE/BANDAGES/DRESSINGS) IMPLANT
GLOVE BIO SURGEON STRL SZ 6 (GLOVE) ×1 IMPLANT
GLOVE INDICATOR 6.5 STRL GRN (GLOVE) ×1 IMPLANT
GOWN STRL REUS W/ TWL LRG LVL3 (GOWN DISPOSABLE) ×1 IMPLANT
GOWN STRL REUS W/TWL LRG LVL3 (GOWN DISPOSABLE) ×1
KIT BASIN OR (CUSTOM PROCEDURE TRAY) ×1 IMPLANT
KIT TURNOVER KIT A (KITS) IMPLANT
MARKER SKIN DUAL TIP RULER LAB (MISCELLANEOUS) ×1 IMPLANT
MESH ULTRAPRO 3X6 7.6X15CM (Mesh General) IMPLANT
NDL HYPO 22X1.5 SAFETY MO (MISCELLANEOUS) ×1 IMPLANT
NEEDLE HYPO 22X1.5 SAFETY MO (MISCELLANEOUS) ×1
PACK GENERAL/GYN (CUSTOM PROCEDURE TRAY) ×1 IMPLANT
SPIKE FLUID TRANSFER (MISCELLANEOUS) ×1 IMPLANT
STRIP CLOSURE SKIN 1/2X4 (GAUZE/BANDAGES/DRESSINGS) ×1 IMPLANT
SUT ETHIBOND 0 MO6 C/R (SUTURE) ×1 IMPLANT
SUT MNCRL AB 4-0 PS2 18 (SUTURE) ×1 IMPLANT
SUT PDS AB 0 CT1 36 (SUTURE) ×2 IMPLANT
SUT VIC AB 3-0 SH 27 (SUTURE) ×2
SUT VIC AB 3-0 SH 27XBRD (SUTURE) ×2 IMPLANT
SUT VICRYL 3 0 BR 18 UND (SUTURE) ×1 IMPLANT
SUT VICRYL AB 3 0 TIES (SUTURE) IMPLANT
SYR CONTROL 10ML LL (SYRINGE) ×1 IMPLANT
TAPE PAPER 3X10 WHT MICROPORE (GAUZE/BANDAGES/DRESSINGS) IMPLANT
TOWEL OR 17X26 10 PK STRL BLUE (TOWEL DISPOSABLE) ×1 IMPLANT
TOWEL OR NON WOVEN STRL DISP B (DISPOSABLE) ×1 IMPLANT

## 2022-11-20 NOTE — Anesthesia Procedure Notes (Signed)
Procedure Name: Intubation Date/Time: 11/20/2022 10:44 AM  Performed by: Lezlie Lye, CRNAPre-anesthesia Checklist: Patient identified, Emergency Drugs available, Suction available and Patient being monitored Patient Re-evaluated:Patient Re-evaluated prior to induction Oxygen Delivery Method: Circle System Utilized Preoxygenation: Pre-oxygenation with 100% oxygen Induction Type: IV induction Ventilation: Mask ventilation without difficulty Laryngoscope Size: Miller and 3 Grade View: Grade I Tube type: Oral Tube size: 7.5 mm Number of attempts: 1 Airway Equipment and Method: Stylet Placement Confirmation: ETT inserted through vocal cords under direct vision, positive ETCO2 and breath sounds checked- equal and bilateral Secured at: 23 cm Tube secured with: Tape Dental Injury: Teeth and Oropharynx as per pre-operative assessment

## 2022-11-20 NOTE — Op Note (Signed)
Operative Note  JONTEL YEAGER  [id number]  [id number]  11/20/2022   Surgeon: Phylliss Blakes MD FACS   Procedure performed: Open right inguinal hernia repair with mesh   Preop diagnosis:  right inguinal hernia   Post-op diagnosis/intraop findings: direct inguinal hernia   Specimens: none   EBL: 5cc   Complications: none   Description of procedure: After confirming informed consent, the patient was taken to the operating room and placed supine on operating room table where general anesthesia was initiated, preoperative antibiotics were administered, SCDs applied, and a formal timeout was performed. The groin was clipped, prepped and draped in the usual sterile fashion. An oblique incision was made the just above the inguinal ligament after infiltrating the tissues with local anesthetic (Exparel mixed with quarter percent Marcaine with epinephrine). Soft tissues were dissected using electrocautery until the external oblique aponeurosis was encountered. This was divided sharply to expand the external ring. A plane was bluntly developed between the spermatic cord and the external oblique. The ilioinguinal nerve was divided between hemostats and each and ligated with 3-0 Vicryl ties. The spermatic cord was then bluntly dissected away from the pubic tubercle and encircled with a Penrose. Inspection of the inguinal anatomy revealed a large direct hernia sac with complete disruption of the inguinal floor.  The spermatic cord was dissected and carefully inspected confirming that there was no indirect hernia sac.  The direct hernia sac was carefully dissected and skeletonized until it could be reduced intact into the abdominal cavity. The inguinal floor was reconstructed suturing the conjoined tendon to the inguinal ligament with interrupted figure of eight 0 PDS, leaving an internal ring just sufficient for the cord structures. A 3 x 6 piece of ultra Pro mesh was brought onto the field and trimmed to  approximate the field. This was sutured to the pubic tubercle fascia, inferior shelving edge and to the internal oblique superiorly with interrupted 0 ethibonds. The tails of the mesh were wrapped around the spermatic cord, ensuring adequate room for the cord, and sutured to each other with 0 ethibond, and then directed laterally to lie flat beneath the external oblique aponeurosis.  An additional Ethibond suture was placed at the end of the slit and the mesh to reinforce this medially.  Hemostasis was ensured within the wound. The Penrose was removed. The external oblique aponeurosis was reapproximated with a running 3-0 Vicryl to re-create a narrowed external ring. More local was infiltrated around the pubic tubercle and in the plane just below the external oblique. The Scarpa's was reapproximated with interrupted 3-0 Vicryls. The skin was closed with a running subcuticular 4-0 Monocryl. The remainder of the local was injected in the subcutaneous and subcuticular space. The field was then cleaned, benzoin and Steri-Strips and sterile bandage were applied. The patient was then awakened extubated and taken to PACU in stable condition.    All counts were correct at the completion of the case

## 2022-11-20 NOTE — Transfer of Care (Signed)
Immediate Anesthesia Transfer of Care Note  Patient: Brian Gray  Procedure(s) Performed: OPEN RIGHT HERNIA REPAIR INGUINAL WITH MESH (Right)  Patient Location: PACU  Anesthesia Type:General  Level of Consciousness: drowsy  Airway & Oxygen Therapy: Patient Spontanous Breathing and Patient connected to face mask oxygen  Post-op Assessment: Report given to RN and Post -op Vital signs reviewed and stable  Post vital signs: Reviewed and stable  Last Vitals:  Vitals Value Taken Time  BP 178/73 11/20/22 1201  Temp    Pulse 77 11/20/22 1202  Resp 13 11/20/22 1202  SpO2 100 % 11/20/22 1202  Vitals shown include unfiled device data.  Last Pain:  Vitals:   11/20/22 0936  TempSrc:   PainSc: 2          Complications: No notable events documented.

## 2022-11-20 NOTE — Discharge Instructions (Signed)
HERNIA REPAIR: POST OP INSTRUCTIONS   EAT Gradually transition to a high fiber diet with a fiber supplement over the next few weeks after discharge.  Start with a pureed / full liquid diet (see below)  WALK Walk an hour a day (cumulative- not all at once).  Control your pain to do that.    CONTROL PAIN Control pain so that you can walk, sleep, tolerate sneezing/coughing, and go up/down stairs.  HAVE A BOWEL MOVEMENT DAILY Keep your bowels regular to avoid problems.  OK to try a laxative to override constipation.  OK to use an antidiarrheal to slow down diarrhea.  Call if not better after 2 tries  CALL IF YOU HAVE PROBLEMS/CONCERNS Call if you are still struggling despite following these instructions. Call if you have concerns not answered by these instructions  ######################################################################    DIET: Follow a light bland diet & liquids the first 24 hours after arrival home, such as soup, liquids, starches, etc.  Be sure to drink plenty of fluids.  Quickly advance to a usual solid diet within a few days.  Avoid fast food or heavy meals initially as you are more likely to get nauseated or have irregular bowels.  A low-sugar, high-fiber diet for the rest of your life is ideal.   Take your usually prescribed home medications unless otherwise directed.  PAIN CONTROL: Pain is best controlled by a usual combination of three different methods TOGETHER: Ice/Heat Over the counter pain medication Prescription pain medication Most patients will experience some swelling and bruising around the hernia(s) such as the bellybutton, groins, or old incisions.  Ice packs or heating pads (30-60 minutes up to 6 times a day) will help. Use ice for the first few days to help decrease swelling and bruising, then switch to heat to help relax tight/sore spots and speed recovery.  Some people prefer to use ice alone, heat alone, alternating between ice & heat.  Experiment  to what works for you.  Swelling and bruising can take several weeks to resolve.   It is helpful to take an over-the-counter pain medication regularly for the first days: Naproxen (Aleve, etc)  Two 220mg tabs twice a day OR Ibuprofen (Advil, etc) Three 200mg tabs four times a day (every meal & bedtime) AND Acetaminophen (Tylenol, etc) 325-650mg four times a day (every meal & bedtime) A  prescription for pain medication should be given to you upon discharge.  Take your pain medication as prescribed, IF NEEDED.  If you are having problems/concerns with the prescription medicine (does not control pain, nausea, vomiting, rash, itching, etc), please call us [phone] to see if we need to switch you to a different pain medicine that will work better for you and/or control your side effect better. If you need a refill on your pain medication, please contact your pharmacy.  They will contact our office to request authorization. Prescriptions will not be filled after 5 pm or on week-ends.  Avoid getting constipated.  Between the surgery and the pain medications, it is common to experience some constipation.  Increasing fluid intake and taking a fiber supplement (such as Metamucil, Citrucel, FiberCon, MiraLax, etc) 1-2 times a day regularly will usually help prevent this problem from occurring.  A mild laxative (prune juice, Milk of Magnesia, MiraLax, etc) should be taken according to package directions if there are no bowel movements after 48 hours.    Wash / shower every day, starting 2 days after surgery.  You may shower over   the steri strips or skin glue which are waterproof.  No rubbing, scrubbing, lotions or ointments to incision(s). Do not soak or submerge.   Remove your outer bandage 2 days after surgery. Steri strips (if present) will peel off after 1-2 weeks. Glue (if present) will flake off after about 2 weeks.  You may leave the incision open to air.  You may replace a dressing/Band-Aid to cover  an incision for comfort if you wish.  Continue to shower over incision(s) after the dressing is off.  ACTIVITIES as tolerated:   You may resume regular (light) daily activities beginning the next day--such as daily self-care, walking, climbing stairs--gradually increasing activities as tolerated.  Control your pain so that you can walk an hour a day.  If you can walk 30 minutes without difficulty, it is safe to try more intense activity such as jogging, treadmill, bicycling, low-impact aerobics, swimming, etc. Refrain from the most intensive and strenuous activity such as sit-ups, heavy lifting, contact sports, etc  Refrain from any heavy lifting or straining until 6 weeks after surgery.   DO NOT PUSH THROUGH PAIN.  Let pain be your guide: If it hurts to do something, don't do it.  Pain is your body warning you to avoid that activity for another week until the pain goes down. You may drive when you are no longer taking prescription pain medication, you can comfortably wear a seatbelt, and you can safely maneuver your car and apply brakes. You may have sexual intercourse when it is comfortable.   FOLLOW UP in our office Please call [facility] at [phone] to set up an appointment to see your surgeon in the office for a follow-up appointment approximately 2-3 weeks after your surgery. Make sure that you call for this appointment the day you arrive home to insure a convenient appointment time.  9.  If you have disability of FMLA / Family leave forms, please bring the forms to the office for processing.  (do not give to your surgeon).  WHEN TO CALL US [phone]: Poor pain control Reactions / problems with new medications (rash/itching, nausea, etc)  Fever over 101.5 F (38.5 C) Inability to urinate Nausea and/or vomiting Worsening swelling or bruising Continued bleeding from incision. Increased pain, redness, or drainage from the incision   The clinic staff is available to answer your  questions during regular business hours (8:30am-5pm).  Please don't hesitate to call and ask to speak to one of our nurses for clinical concerns.   If you have a medical emergency, go to the nearest emergency room or call 911.  A surgeon from [facility] is always on call at the hospitals in [place]  [facility] [address], [address], [place], [place]  [address] ?  [address] [address], [place], [place]   [address] MAIN: [phone] ? TOLL FREE: [phone] ? FAX: [phone] [email or web address]  

## 2022-11-20 NOTE — Anesthesia Postprocedure Evaluation (Signed)
Anesthesia Post Note  Patient: Brian Gray  Procedure(s) Performed: OPEN RIGHT HERNIA REPAIR INGUINAL WITH MESH (Right)     Patient location during evaluation: PACU Anesthesia Type: General Level of consciousness: awake Pain management: pain level controlled Vital Signs Assessment: post-procedure vital signs reviewed and stable Respiratory status: spontaneous breathing, nonlabored ventilation and respiratory function stable Cardiovascular status: blood pressure returned to baseline and stable Postop Assessment: no apparent nausea or vomiting Anesthetic complications: no   No notable events documented.  Last Vitals:  Vitals:   11/20/22 1230 11/20/22 1254  BP: (!) 145/70 (!) 151/78  Pulse: 81 76  Resp: 16   Temp: 36.4 C   SpO2: 97% 99%    Last Pain:  Vitals:   11/20/22 1254  TempSrc:   PainSc: 0-No pain                 Linton Rump

## 2022-11-20 NOTE — Interval H&P Note (Signed)
History and Physical Interval Note:  11/20/2022 10:21 AM  Brian Gray  has presented today for surgery, with the diagnosis of INGUINAL HERNIA.  The various methods of treatment have been discussed with the patient and family. After consideration of risks, benefits and other options for treatment, the patient has consented to  Procedure(s): OPEN RIGHT HERNIA REPAIR INGUINAL WITH MESH (Right) as a surgical intervention.  The patient's history has been reviewed, patient examined, no change in status, stable for surgery.  I have reviewed the patient's chart and labs.  Questions were answered to the patient's satisfaction.     Jerlyn Pain Lollie Sails

## 2022-11-21 ENCOUNTER — Encounter (HOSPITAL_COMMUNITY): Payer: Self-pay | Admitting: Surgery

## 2022-12-24 ENCOUNTER — Other Ambulatory Visit: Payer: Self-pay | Admitting: Cardiovascular Disease

## 2023-01-07 ENCOUNTER — Ambulatory Visit
Admission: RE | Admit: 2023-01-07 | Discharge: 2023-01-07 | Disposition: A | Discharge status: Home / Self Care | Payer: Medicare Other | Source: Ambulatory Visit | Attending: Thoracic Surgery (Cardiothoracic Vascular Surgery)

## 2023-01-07 DIAGNOSIS — I77810 Thoracic aortic ectasia: Secondary | ICD-10-CM

## 2023-01-07 MED ORDER — IOPAMIDOL (ISOVUE-370) INJECTION 76%
500.0000 mL | Freq: Once | INTRAVENOUS | Status: AC | PRN
  Administered 2023-01-07: 75 mL via INTRAVENOUS

## 2023-01-09 ENCOUNTER — Ambulatory Visit: Payer: Medicare Other | Admitting: Thoracic Surgery (Cardiothoracic Vascular Surgery)

## 2023-01-15 ENCOUNTER — Ambulatory Visit: Payer: Medicare Other

## 2023-01-16 ENCOUNTER — Ambulatory Visit: Payer: Medicare Other | Admitting: Thoracic Surgery (Cardiothoracic Vascular Surgery)

## 2023-01-26 NOTE — Progress Notes (Unsigned)
HPI: Brian Gray is a 77 year old gentleman with past medical history notable for dyslipidemia, hypertension, and type 2 diabetes mellitus.  He was diagnosed with a thoracic aortic aneurysm over 15 years ago and has been followed by our office for annual surveillance since around 2010.  The aneurysm has remained stable. Brian Gray returns today for scheduled annual follow-up after CTA chest.   Current Outpatient Medications  Medication Sig Dispense Refill   aspirin EC 81 MG tablet Take 1 tablet (81 mg total) by mouth daily. 30 tablet 11   atorvastatin (LIPITOR) 40 MG tablet Take 40 mg by mouth daily.  3   celecoxib (CELEBREX) 100 MG capsule Take 100 mg by mouth 2 (two) times daily. Twice a day with food     hydrochlorothiazide (HYDRODIURIL) 12.5 MG tablet Take 12.5 mg by mouth daily.     lisinopril (ZESTRIL) 40 MG tablet TAKE 1 TABLET BY MOUTH EVERY DAY 90 tablet 3   metFORMIN (GLUCOPHAGE-XR) 500 MG 24 hr tablet Take 500 mg by mouth 2 (two) times daily.     metoprolol succinate (TOPROL-XL) 25 MG 24 hr tablet TAKE 1 TABLET BY MOUTH DAILY 90 tablet 1   naproxen sodium (ANAPROX) 220 MG tablet Take 220 mg by mouth every 8 (eight) hours as needed (Headache).     pantoprazole (PROTONIX) 40 MG tablet Take 1 tablet (40 mg total) by mouth daily. 30 tablet 1   potassium chloride (KLOR-CON M) 10 MEQ tablet Take 10 mEq by mouth daily as needed (cramps).     PRESCRIPTION MEDICATION Apply 1 application. topically daily as needed (dry skin). Fluticasone 0.5% & ketoconazole 2%     sildenafil (VIAGRA) 50 MG tablet Take 50 mg by mouth as needed for erectile dysfunction.     No current facility-administered medications for this visit.    Physical Exam: ***  Diagnostic Tests: CLINICAL DATA:  Follow-up thoracic aortic aneurysm   EXAM: CT ANGIOGRAPHY CHEST WITH CONTRAST   TECHNIQUE: Multidetector CT imaging of the chest was performed using the standard protocol during bolus administration of  intravenous contrast. Multiplanar CT image reconstructions and MIPs were obtained to evaluate the vascular anatomy.   RADIATION DOSE REDUCTION: This exam was performed according to the departmental dose-optimization program which includes automated exposure control, adjustment of the mA and/or kV according to patient size and/or use of iterative reconstruction technique.   CONTRAST:  75mL ISOVUE-370 IOPAMIDOL (ISOVUE-370) INJECTION 76%   COMPARISON:  12/24/2021   FINDINGS: Cardiovascular: SVC patent. Heart size normal. No pericardial effusion. Satisfactory opacification of pulmonary arteries noted, and there is no evidence of pulmonary emboli. Scattered left coronary calcifications. Good contrast opacification of the thoracic aorta, without dissection or stenosis. Patent proximal brachiocephalic arterial origin anatomy without stenosis. Minimal partially calcified plaque in the arch and descending thoracic aorta.   Aortic Root:   --Valve: 3.1 cm   --Sinuses: 4.6 cm   --Sinotubular Junction: 3.7 cm   Limitations by motion: Moderate   Thoracic Aorta:   --Ascending Aorta: 4.2 cm (previously 4.2)   --Aortic Arch: 3.7 cm   --Descending Aorta: 3.6 cm proximally, tapering to 2.5 cm distally cm   Mediastinum/Nodes: No hematoma, mass, or adenopathy.   Lungs/Pleura: No pleural effusion. No pneumothorax. Lungs are clear.   Upper Abdomen: No acute findings.   Musculoskeletal: No chest wall abnormality. No acute or significant osseous findings.   Review of the MIP images confirms the above findings.   IMPRESSION: 1. 4.6 cm aortic root and stable 4.2 cm  ascending thoracic aortic aneurysm. Recommend annual imaging followup by CTA or MRA. This recommendation follows 2010 ACCF/AHA/AATS/ACR/ASA/SCA/SCAI/SIR/STS/SVM Guidelines for the Diagnosis and Management of Patients with Thoracic Aortic Disease. Circulation. 2010; 121: U045-W098 2. Coronary and Aortic Atherosclerosis  (ICD10-170.0).     Electronically Signed   By: Corlis Leak M.D.   On: 01/08/2023 18:08    Impression / Plan: No significant change in the 4.2 cm thoracic aortic aneurysm.  Continued annual imaging is recommended.  We reviewed the recommendations for careful blood pressure control, treatment of dyslipidemia, and avoidance of repetitive strenuous activity. Return in 1 year with CTA chest.   Leary Roca, PA-C [facility] [phone]

## 2023-01-29 ENCOUNTER — Ambulatory Visit: Payer: Medicare Other | Admitting: Physician Assistant

## 2023-01-29 VITALS — BP 140/74 | HR 72 | Resp 20 | Ht 71.0 in | Wt 178.0 lb

## 2023-01-29 DIAGNOSIS — I77810 Thoracic aortic ectasia: Secondary | ICD-10-CM

## 2023-01-29 NOTE — Patient Instructions (Signed)
Avoid strenuous activity with no lifting greater than 30 pounds  Maintain healthy blood pressure with a goal of less than 130/90  Follow-up in 1 year with CTA chest

## 2023-03-03 ENCOUNTER — Other Ambulatory Visit: Payer: Self-pay | Admitting: Cardiovascular Disease

## 2023-03-04 ENCOUNTER — Other Ambulatory Visit: Payer: Self-pay | Admitting: Cardiovascular Disease

## 2023-05-30 ENCOUNTER — Other Ambulatory Visit: Payer: Self-pay | Admitting: Cardiovascular Disease

## 2023-06-25 ENCOUNTER — Other Ambulatory Visit: Payer: Self-pay | Admitting: Cardiovascular Disease

## 2023-07-06 ENCOUNTER — Other Ambulatory Visit: Payer: Self-pay | Admitting: Cardiovascular Disease

## 2023-07-10 ENCOUNTER — Other Ambulatory Visit: Payer: Self-pay | Admitting: Cardiovascular Disease

## 2023-10-14 ENCOUNTER — Other Ambulatory Visit: Payer: Self-pay | Admitting: Cardiovascular Disease

## 2023-11-04 ENCOUNTER — Other Ambulatory Visit: Payer: Self-pay | Admitting: Cardiovascular Disease

## 2023-11-04 ENCOUNTER — Telehealth: Payer: Self-pay | Admitting: Cardiovascular Disease

## 2023-11-04 MED ORDER — LISINOPRIL 40 MG PO TABS: 40.0000 mg | ORAL_TABLET | Freq: Every day | ORAL | 0 refills | Status: DC

## 2023-11-04 NOTE — Telephone Encounter (Signed)
*  STAT* If patient is at the pharmacy, call can be transferred to refill team.   1. Which medications need to be refilled? (please list name of each medication and dose if known)  lisinopril  (ZESTRIL ) 40 MG tablet   2. Which pharmacy/location (including street and city if local pharmacy) is medication to be sent to? [id number] - [place], [place] - [address] [facility] [address] [facility]    3. Do they need a 30 day or 90 day supply?  90 day supply

## 2023-11-04 NOTE — Telephone Encounter (Signed)
 Pt's medication was sent to pt's pharmacy as requested. Confirmation received.

## 2023-12-03 ENCOUNTER — Other Ambulatory Visit: Payer: Self-pay | Admitting: Cardiovascular Disease

## 2023-12-22 ENCOUNTER — Other Ambulatory Visit: Payer: Self-pay | Admitting: Thoracic Surgery (Cardiothoracic Vascular Surgery)

## 2023-12-22 DIAGNOSIS — I77810 Thoracic aortic ectasia: Secondary | ICD-10-CM

## 2024-01-25 ENCOUNTER — Ambulatory Visit (HOSPITAL_COMMUNITY)
Admission: RE | Admit: 2024-01-25 | Discharge: 2024-01-25 | Disposition: A | Discharge status: Home / Self Care | Source: Ambulatory Visit | Attending: Thoracic Surgery (Cardiothoracic Vascular Surgery) | Admitting: Thoracic Surgery (Cardiothoracic Vascular Surgery)

## 2024-01-25 DIAGNOSIS — I77810 Thoracic aortic ectasia: Secondary | ICD-10-CM | POA: Insufficient documentation

## 2024-01-25 LAB — POCT I-STAT CREATININE: Creatinine, Ser: 1.2 mg/dL (ref 0.61–1.24)

## 2024-01-25 MED ORDER — IOHEXOL 350 MG/ML SOLN
75.0000 mL | Freq: Once | INTRAVENOUS | Status: AC | PRN
  Administered 2024-01-25: 75 mL via INTRAVENOUS

## 2024-01-27 ENCOUNTER — Encounter: Payer: Self-pay | Admitting: Cardiovascular Disease

## 2024-01-27 ENCOUNTER — Ambulatory Visit: Attending: Cardiovascular Disease | Admitting: Cardiovascular Disease

## 2024-01-27 VITALS — BP 170/84 | HR 63 | Ht 71.5 in | Wt 173.4 lb

## 2024-01-27 DIAGNOSIS — E7800 Pure hypercholesterolemia, unspecified: Secondary | ICD-10-CM

## 2024-01-27 DIAGNOSIS — E119 Type 2 diabetes mellitus without complications: Secondary | ICD-10-CM | POA: Diagnosis not present

## 2024-01-27 DIAGNOSIS — I10 Essential (primary) hypertension: Secondary | ICD-10-CM

## 2024-01-27 DIAGNOSIS — I7121 Aneurysm of the ascending aorta, without rupture: Secondary | ICD-10-CM

## 2024-01-27 NOTE — Progress Notes (Signed)
 Cardiology Office Note:    Date:  01/30/2024   ID:  Brian Gray, DOB 03/04/1946, MRN [id number]  PCP:  Suanne Pfeiffer, NP  Cardiologist:  Jerel Balding, MD    Referring MD: Suanne Pfeiffer, NP   chief complaint: F/u ascending aortic aneurysm, hypertension, hyperlipidemia  History of Present Illness:    Brian Gray is a 78 y.o. male with a hx of mild dilation of the ascending aorta ( stable since 2010, most recent CTA 01/25/2024 sinuses of Valsalva 4.7 cm, ascending aorta 4.2 cm), history of false positive nuclear stress test leading to a coronary angiogram in 2012 that showed no significant stenoses, treated hyperlipidemia, hypertension and borderline diabetes mellitus, returning for follow-up.  He is feeling well.  He has no cardiovascular complaints.  Remains physically active.The patient specifically denies any chest pain at rest or with exertion, dyspnea at rest or with exertion, orthopnea, paroxysmal nocturnal dyspnea, syncope, palpitations, focal neurological deficits, intermittent claudication, lower extremity edema, unexplained weight gain, cough, hemoptysis or wheezing.  As often happens his blood pressure in the office is high.  It was 170/84 and then when rechecked 10 minutes later 160/89.  He reports that at home earlier today his blood pressure was 134/70 which is typical for him.  He is followed by Dr. Lucas for his ascending aortic aneurysm which has been stable.  His most recent CT performed 01/25/2024 shows a very slight increase in the diameter of the sinuses of Valsalva which is now 4.7 cm compared to 4.6 cm a year ago and previous values of 4.4-4.5 cm going back to 2012.  The ascending aorta is unchanged at 4.2 cm.  In 2005 the ascending aorta was measured as 4.1 cm.  Metabolic profile is fair with a recent hemoglobin A1c of 5.8% which is a big improvement compared to a year ago when it was 6.9%.  Has an excellent LDL at 61 with a borderline low HDL of 39.  Past  Medical History:  Diagnosis Date   Diabetes mellitus without complication (HCC)    GERD (gastroesophageal reflux disease)    Hyperlipidemia 06/10/2018   Hypertension 06/10/2018   Pneumonia    as child    Past Surgical History:  Procedure Laterality Date   COLONOSCOPY  05/15/2011   Procedure: COLONOSCOPY;  Surgeon: Claudis RAYMOND Rivet, MD;  Location: [facility];  Service: Endoscopy;  Laterality: N/A;  930   COLONOSCOPY WITH PROPOFOL  N/A 07/17/2021   Procedure: COLONOSCOPY WITH PROPOFOL ;  Surgeon: Rivet Claudis RAYMOND, MD;  Location: [facility];  Service: Endoscopy;  Laterality: N/A;  730   DOPPLER ECHOCARDIOGRAPHY  03/20/2008   EF>55%   INGUINAL HERNIA REPAIR Right 11/20/2022   Procedure: OPEN RIGHT HERNIA REPAIR INGUINAL WITH MESH;  Surgeon: Signe Mitzie LABOR, MD;  Location: [facility];  Service: General;  Laterality: Right;   LEFT HEART CATHETERIZATION WITH CORONARY ANGIOGRAM N/A 02/21/2011   Procedure: LEFT HEART CATHETERIZATION WITH CORONARY ANGIOGRAM;  Surgeon: Jerel Balding, MD;  Location: [facility];  Service: Cardiovascular;  Normal Coronary Arteries   NM MYOVIEW  LTD  02/04/2011   EF 58%   POLYPECTOMY  07/17/2021   Procedure: POLYPECTOMY INTESTINAL;  Surgeon: Rivet Claudis RAYMOND, MD;  Location: [facility];  Service: Endoscopy;;    Current Medications: Current Meds  Medication Sig   amLODipine (NORVASC) 2.5 MG tablet Take 2.5 mg by mouth daily.   aspirin  EC 81 MG tablet Take 1 tablet (81 mg total) by mouth daily.   atorvastatin  (LIPITOR)  40 MG tablet Take 40 mg by mouth daily.   hydrochlorothiazide  (HYDRODIURIL ) 12.5 MG tablet Take 12.5 mg by mouth daily.   lisinopril  (ZESTRIL ) 40 MG tablet Take 1 tablet (40 mg total) by mouth daily.   metFORMIN  (GLUCOPHAGE -XR) 500 MG 24 hr tablet Take 500 mg by mouth 2 (two) times daily.   metoprolol  succinate (TOPROL -XL) 25 MG 24 hr tablet TAKE 1 TABLET BY MOUTH ONCE  DAILY   pantoprazole  (PROTONIX ) 40 MG tablet Take 1 tablet (40 mg total) by  mouth daily.     Allergies:   Patient has no known allergies.   Family History: The patient's family history is negative for Colon cancer.,  Aortic aneurysm/dissection or sudden death ROS:   Please see the history of present illness.    All other systems are reviewed and are negative.   EKGs/Labs/Other Studies Reviewed:    The following studies were reviewed today: CT angiogram 01/25/2024  EKG:  EKG is not ordered today and personally reviewed.  It is very similar to previous tracings show normal sinus rhythm and an old right bundle branch block, no ischemic repolarization abnormalities.  QTc 495 ms  Recent Labs:  BMET    Component Value Date/Time   NA 136 11/18/2022 0922   K 3.5 11/18/2022 0922   CL 101 11/18/2022 0922   CO2 27 11/18/2022 0922   GLUCOSE 133 (H) 11/18/2022 0922   BUN 21 11/18/2022 0922   CREATININE 1.20 01/25/2024 1211   CREATININE 1.04 04/23/2015 1014   CALCIUM  8.9 11/18/2022 0922   GFRNONAA >60 11/18/2022 0922     Recent Lipid Panel 11/18/2023 Cholesterol 115, HDL 39, LDL 61, triglycerides 69 Hemoglobin A1c 5.8%  Lipid Panel  No results found for: CHOL, TRIG, HDL, CHOLHDL, VLDL, LDLCALC, LDLDIRECT, LABVLDL   Physical Exam:    VS:  BP (!) 170/84 (BP Location: Left Arm, Patient Position: Sitting)   Pulse 63   Ht 5' 11.5 (1.816 m)   Wt 173 lb 6.4 oz (78.7 kg)   SpO2 96%   BMI 23.85 kg/m    recheck blood pressure by me was 170/88.  Wt Readings from Last 3 Encounters:  01/27/24 173 lb 6.4 oz (78.7 kg)  01/29/23 178 lb (80.7 kg)  11/20/22 169 lb (76.7 kg)     General: Alert, oriented x3, no distress, appears younger than stated age Head: no evidence of trauma, PERRL, EOMI, no exophtalmos or lid lag, no myxedema, no xanthelasma; normal ears, nose and oropharynx Neck: normal jugular venous pulsations and no hepatojugular reflux; brisk carotid pulses without delay and no carotid bruits Chest: clear to auscultation, no signs of  consolidation by percussion or palpation, normal fremitus, symmetrical and full respiratory excursions Cardiovascular: normal position and quality of the apical impulse, regular rhythm, normal first and second heart sounds, no murmurs, rubs or gallops Abdomen: no tenderness or distention, no masses by palpation, no abnormal pulsatility or arterial bruits, normal bowel sounds, no hepatosplenomegaly Extremities: no clubbing, cyanosis or edema; 2+ radial, ulnar and brachial pulses bilaterally; 2+ right femoral, posterior tibial and dorsalis pedis pulses; 2+ left femoral, posterior tibial and dorsalis pedis pulses; no subclavian or femoral bruits Neurological: grossly nonfocal Psych: Normal mood and affect    ASSESSMENT:    1. Aneurysm of ascending aorta without rupture   2. Primary hypertension   3. Hypercholesterolemia   4. Type 2 diabetes mellitus without complication, without long-term current use of insulin  (HCC)      PLAN:    In order  of problems listed above:  AATA: Very slow increase in diameter.  The aorta measured 4.1 cm in 2005.  Now with a maximum diameter of 4.7 cm at the sinuses of Valsalva but only 4.2 cm in the ascending aorta.  Follow-up in the CV surgery clinic by Dr. Lucas. HTN: Blood pressure elevated in clinic today, but better with his home monitor.  He has been advised to start amlodipine 2.5 mg once daily and I agree.  Due to his aortic aneurysm it is important to keep his blood pressure consistently less than 130/80.  Asked him to send a blood pressure log in 1 week. HLP: Borderline low HDL cholesterol but otherwise excellent lipid parameters. DM: Marked improvement in hemoglobin A1c just with changes in his diet and a very low-dose of metformin .  Most recent hemoglobin A1c much better at 5.8% per his report.   Medication Adjustments/Labs and Tests Ordered: Current medicines are reviewed at length with the patient today.  Concerns regarding medicines are outlined  above.  Orders Placed This Encounter  Procedures   EKG 12-Lead   Patient Instructions  Medication Instructions:  No changes *If you need a refill on your cardiac medications before your next appointment, please call your pharmacy*  Please send a BP log in one week.   Lab Work: None ordered If you have labs (blood work) drawn today and your tests are completely normal, you will receive your results only by: MyChart Message (if you have MyChart) OR A paper copy in the mail If you have any lab test that is abnormal or we need to change your treatment, we will call you to review the results.  Testing/Procedures: None ordered  Follow-Up: At [facility], you and your health needs are our priority.  As part of our continuing mission to provide you with exceptional heart care, our providers are all part of one team.  This team includes your primary Cardiologist (physician) and Advanced Practice Providers or APPs (Physician Assistants and Nurse Practitioners) who all work together to provide you with the care you need, when you need it.  Your next appointment:   1 year(s)  Provider:   Jerel Balding, MD    We recommend signing up for the patient portal called MyChart.  Sign up information is provided on this After Visit Summary.  MyChart is used to connect with patients for Virtual Visits (Telemedicine).  Patients are able to view lab/test results, encounter notes, upcoming appointments, etc.  Non-urgent messages can be sent to your provider as well.   To learn more about what you can do with MyChart, go to [email or web address].       Signed, Jerel Balding, MD  01/30/2024 5:27 PM    [facility]

## 2024-01-27 NOTE — Patient Instructions (Signed)
 Medication Instructions:  No changes *If you need a refill on your cardiac medications before your next appointment, please call your pharmacy*  Please send a BP log in one week.   Lab Work: None ordered If you have labs (blood work) drawn today and your tests are completely normal, you will receive your results only by: MyChart Message (if you have MyChart) OR A paper copy in the mail If you have any lab test that is abnormal or we need to change your treatment, we will call you to review the results.  Testing/Procedures: None ordered  Follow-Up: At [facility], you and your health needs are our priority.  As part of our continuing mission to provide you with exceptional heart care, our providers are all part of one team.  This team includes your primary Cardiologist (physician) and Advanced Practice Providers or APPs (Physician Assistants and Nurse Practitioners) who all work together to provide you with the care you need, when you need it.  Your next appointment:   1 year(s)  Provider:   Jerel Balding, MD    We recommend signing up for the patient portal called MyChart.  Sign up information is provided on this After Visit Summary.  MyChart is used to connect with patients for Virtual Visits (Telemedicine).  Patients are able to view lab/test results, encounter notes, upcoming appointments, etc.  Non-urgent messages can be sent to your provider as well.   To learn more about what you can do with MyChart, go to [email or web address].

## 2024-01-31 ENCOUNTER — Other Ambulatory Visit: Payer: Self-pay | Admitting: Cardiovascular Disease

## 2024-02-02 ENCOUNTER — Ambulatory Visit

## 2024-02-02 VITALS — BP 138/80 | HR 73 | Resp 20 | Ht 71.5 in | Wt 176.0 lb

## 2024-02-02 DIAGNOSIS — I77810 Thoracic aortic ectasia: Secondary | ICD-10-CM | POA: Diagnosis not present

## 2024-02-02 NOTE — Patient Instructions (Signed)
 Risk Modification in those with ascending thoracic aortic aneurysm:   Continue control of blood pressure (prefer BP 130/80 or less)   2. Avoid fluoroquinolone antibiotics (I.e Ciprofloxacin, Avelox, Levofloxacin, Ofloxacin)   3.  Use of statin (to decrease cardiovascular risk)   4.  Exercise and activity limitations is individualized, but in general, contact sports are to be avoided and one should avoid heavy lifting (defined as half of ideal body weight) and exercises involving sustained Valsalva maneuver.   5.  Follow-up in one year with CTA chest.  OK to use a non-contrast CT if you have had a recent study for surveillance of the lung nodule.

## 2024-02-02 NOTE — Progress Notes (Signed)
 [address] [place] [address]             [phone]            Brian Gray [id number] [birth year]   History of Present Illness:  Brian Gray is a 78 year old man with medical history of hypertension, GERD, type 2 diabetes, B12 deficiency Brian hyperlipidemia, presents for continued follow-up of aortic root dilation Brian ascending thoracic aortic aneurysm.  On recent CTA of chest aortic root dilation measured 4.7 cm Brian ascending thoracic aortic aneurysm measured at 4.2 cm.  Echocardiogram from 2022 showed that the aortic valve was tricuspid Brian there was mild aortic valve regurgitation.  Brian presents to the clinic today Brian reports that Brian has been doing well. His blood pressure is well controlled with current medication therapy.  Brian was recently started on amlodipine 2.5 mg which has helped lower his blood pressure.  Brian does have some lower leg swelling from this medication.  Brian is active Brian walks for exercise.  Brian denies heavy lifting. Brian denies chest pain Brian shortness of breath.     Current Outpatient Medications on File Prior to Visit  Medication Sig Dispense Refill   amLODipine (NORVASC) 2.5 MG tablet Take 2.5 mg by mouth daily.     aspirin  EC 81 MG tablet Take 1 tablet (81 mg total) by mouth daily. 30 tablet 11   atorvastatin  (LIPITOR) 40 MG tablet Take 40 mg by mouth daily.  3   Cholecalciferol (VITAMIN D-3) 125 MCG (5000 UT) TABS      hydrochlorothiazide  (HYDRODIURIL ) 12.5 MG tablet Take 12.5 mg by mouth daily.     lisinopril  (ZESTRIL ) 40 MG tablet TAKE 1 TABLET BY MOUTH EVERY DAY 90 tablet 3   metFORMIN  (GLUCOPHAGE -XR) 500 MG 24 hr tablet Take 500 mg by mouth 2 (two) times daily.     metoprolol  succinate (TOPROL -XL) 25 MG 24 hr tablet TAKE 1 TABLET BY MOUTH ONCE  DAILY 100 tablet 0   pantoprazole  (PROTONIX ) 40 MG tablet Take 1 tablet (40 mg total) by mouth daily. 30 tablet 1   potassium chloride  (KLOR-CON  M) 10 MEQ tablet Take 10 mEq by mouth  daily as needed (cramps).     sildenafil (VIAGRA) 50 MG tablet Take 50 mg by mouth as needed for erectile dysfunction.     No current facility-administered medications on file prior to visit.     ROS: Review of Systems  Constitutional: Negative.  Negative for malaise/fatigue.  Respiratory: Negative.  Negative for cough Brian shortness of breath.   Cardiovascular:  Positive for leg swelling. Negative for chest pain Brian palpitations.       Due to amlodipine      BP (!) 147/77 (BP Location: Left Arm, Patient Position: Sitting, Cuff Size: Normal)   Pulse 73   Resp 20   Ht 5' 11.5 (1.816 m)   Wt 176 lb (79.8 kg)   SpO2 96% Comment: RA  BMI 24.20 kg/m   Physical Exam Constitutional:      Appearance: Normal appearance.  HENT:     Head: Normocephalic Brian atraumatic.  Cardiovascular:     Rate Brian Rhythm: Normal rate Brian regular rhythm.     Heart sounds: Normal heart sounds, S1 normal Brian S2 normal.  Pulmonary:     Effort: Pulmonary effort is normal.     Breath sounds: Normal breath sounds.  Skin:    General: Skin is  warm Brian dry.  Neurological:     General: No focal deficit present.     Mental Status: Brian Gray, Brian Gray, Brian Gray.      Imaging: CLINICAL DATA:  Aortic aneurysm.   EXAM: CT ANGIOGRAPHY CHEST WITH CONTRAST   TECHNIQUE: Multidetector CT imaging of the chest was performed using the standard protocol during bolus administration of intravenous contrast. Multiplanar CT image reconstructions Brian MIPs were obtained to evaluate the vascular anatomy.   RADIATION DOSE REDUCTION: This exam was performed according to the departmental dose-optimization program which includes automated exposure control, adjustment of the mA Brian/or kV according to patient size Brian/or use of iterative reconstruction technique.   CONTRAST:  75mL OMNIPAQUE  IOHEXOL  350 MG/ML SOLN   COMPARISON:  01/07/2023.   FINDINGS: Cardiovascular: Atherosclerotic  calcification of the aorta Brian left anterior descending coronary artery. Aortic root measures 4.7 cm (coronal image 109) Brian ascending aorta measures 4.2 cm (601/114), stable. Heart is at the upper limits of normal in size. Left ventricle may be somewhat hypertrophied. No pericardial effusion.   Mediastinum/Nodes: No pathologically enlarged mediastinal, hilar or axillary lymph nodes. Esophagus is grossly unremarkable.   Lungs/Pleura: Calcified granulomas. No pleural fluid. Airway is unremarkable.   Upper Abdomen: There may be slight thickening of the right adrenal gland. Low-attenuation lesions in the kidneys. No specific follow-up necessary. Visualized portions of the liver, gallbladder, adrenal glands, kidneys, spleen, pancreas, stomach Brian bowel are otherwise grossly unremarkable. No upper abdominal adenopathy.   Musculoskeletal: Degenerative changes in the spine.   Review of the MIP images confirms the above findings.   IMPRESSION: 1. 4.2 cm ascending aortic aneurysm Brian 4.7 cm aortic root, stable. Recommend annual imaging followup by CTA or MRA. This recommendation follows 2010 ACCF/AHA/AATS/ACR/ASA/SCA/SCAI/SIR/STS/SVM Guidelines for the Diagnosis Brian Management of Patients with Thoracic Aortic Disease. Circulation. 2010; 121: [id number]. Aortic aneurysm NOS (ICD10-I71.9). 2. Aortic atherosclerosis (ICD10-I70.0). Coronary artery calcification.     Electronically Signed   By: Brian Gray M.D.   On: 01/25/2024 13:51     A/P: Ascending aorta dilatation - 4.7 cm aortic root dilation Brian 4.2 cm ascending thoracic aortic aneurysm on recent CTA of chest.  Echocardiogram showed tricuspid aortic valve. -We discussed the natural history Brian Brian risk factors for growth of ascending aortic aneurysms. Discussed recommendations to minimize the risk of further expansion or dissection including careful blood pressure control, avoidance of contact sports Brian heavy lifting,  attention to lipid management.  We covered the importance of staying never user of tobacco.  The patient does not yet meet surgical criteria of >5.5cm. The patient is aware of signs Brian symptoms of aortic dissection Brian when to present to the emergency department    - Follow-up in one year with CTA of chest for continued surveillance    Risk Modification:  Statin: Atorvastatin   Smoking cessation instruction/counseling given:  never user  Patient was counseled on importance of Blood Pressure Control  They are instructed to contact their Primary Care Physician if they start to have blood pressure readings over 130s/90s. Do not ever stop blood pressure medications on your own, unless instructed by healthcare professional.  Please avoid use of Fluoroquinolones as this can potentially increase your risk of Aortic Rupture Brian/or Dissection  Patient educated on signs Brian symptoms of Aortic Dissection, handout also provided in AVS  Manuelita CHRISTELLA Rough, PA-C 02/02/24

## 2024-02-24 ENCOUNTER — Ambulatory Visit
Admission: EM | Admit: 2024-02-24 | Discharge: 2024-02-24 | Disposition: A | Discharge status: Home / Self Care | Source: Home / Self Care

## 2024-02-24 DIAGNOSIS — J069 Acute upper respiratory infection, unspecified: Secondary | ICD-10-CM | POA: Diagnosis not present

## 2024-02-24 MED ORDER — AZELASTINE HCL 0.1 % NA SOLN: 1.0000 | Freq: Two times a day (BID) | NASAL | 0 refills | Status: AC

## 2024-02-24 MED ORDER — PROMETHAZINE-DM 6.25-15 MG/5ML PO SYRP: 5.0000 mL | ORAL_SOLUTION | Freq: Four times a day (QID) | ORAL | 0 refills | Status: AC | PRN

## 2024-02-24 NOTE — ED Provider Notes (Signed)
 [facility]    CSN: [id number] Arrival date & time: 02/24/24  1134      History   Chief Complaint No chief complaint on file.   HPI Brian Gray is a 78 y.o. male.   Patient presenting today with 2-day history of cough, hoarseness, sore throat.  Denies fever, chills, chest pain, shortness of breath, abdominal pain, vomiting, diarrhea.  So far trying over-the-counter remedies with minimal relief.  Wife recently sick with similar symptoms.  No known history of chronic pulmonary disease.    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   Hypertension 04/29/2012   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    Past Surgical History:  Procedure Laterality Date   COLONOSCOPY  05/15/2011   Procedure: COLONOSCOPY;  Surgeon: Claudis RAYMOND Rivet, MD;  Location: [facility];  Service: Endoscopy;  Laterality: N/A;  930   COLONOSCOPY WITH PROPOFOL  N/A 07/17/2021   Procedure: COLONOSCOPY WITH PROPOFOL ;  Surgeon: Rivet Claudis RAYMOND, MD;  Location: [facility];  Service: Endoscopy;  Laterality: N/A;  730   DOPPLER ECHOCARDIOGRAPHY  03/20/2008   EF>55%   INGUINAL HERNIA REPAIR Right 11/20/2022   Procedure: OPEN RIGHT HERNIA REPAIR INGUINAL WITH MESH;  Surgeon: Signe Mitzie LABOR, MD;  Location: [facility];  Service: General;  Laterality: Right;   LEFT HEART CATHETERIZATION WITH CORONARY ANGIOGRAM N/A 02/21/2011   Procedure: LEFT HEART  CATHETERIZATION WITH CORONARY ANGIOGRAM;  Surgeon: Jerel Balding, MD;  Location: [facility];  Service: Cardiovascular;  Normal Coronary Arteries   NM MYOVIEW  LTD  02/04/2011   EF 58%   POLYPECTOMY  07/17/2021   Procedure: POLYPECTOMY INTESTINAL;  Surgeon: Rivet Claudis RAYMOND, MD;  Location: [facility];  Service: Endoscopy;;       Home Medications    Prior to Admission medications  Medication Sig Start Date End Date Taking? Authorizing Provider  azelastine  (ASTELIN ) 0.1 % nasal spray Place 1 spray into both nostrils 2 (two) times daily. Use in each nostril as directed 02/24/24  Yes Stuart Vernell Norris, PA-C  promethazine -dextromethorphan (PROMETHAZINE -DM) 6.25-15 MG/5ML syrup Take 5 mLs by mouth 4 (four) times daily as needed. 02/24/24  Yes Stuart Vernell Norris, PA-C  amLODipine (NORVASC) 2.5 MG tablet Take 2.5 mg by mouth daily. 01/12/24   [provider]  aspirin  EC 81 MG tablet Take 1 tablet (81 mg total) by mouth daily. 07/18/21   Rehman, Claudis RAYMOND, MD  atorvastatin  (LIPITOR) 40 MG tablet Take 40 mg by mouth daily. 03/17/14   [provider]  Cholecalciferol (VITAMIN D-3) 125 MCG (5000 UT) TABS     [provider]  hydrochlorothiazide  (HYDRODIURIL ) 12.5 MG tablet Take 12.5 mg by mouth daily.    [provider]  lisinopril  (ZESTRIL ) 40 MG tablet TAKE 1 TABLET BY MOUTH EVERY DAY 02/02/24   Croitoru, Mihai, MD  metFORMIN  (GLUCOPHAGE -XR) 500 MG 24 hr tablet Take 500 mg by mouth 2 (two) times  daily. 04/15/21   [provider]  metoprolol  succinate (TOPROL -XL) 25 MG 24 hr tablet TAKE 1 TABLET BY MOUTH ONCE  DAILY 12/04/23   Croitoru, Mihai, MD  pantoprazole  (PROTONIX ) 40 MG tablet Take 1 tablet (40 mg total) by mouth daily. 06/10/18 02/02/24  Ricky Fines, MD  potassium chloride  (KLOR-CON  M) 10 MEQ tablet Take 10 mEq by mouth daily as needed (cramps).    [provider]  sildenafil (VIAGRA) 50 MG tablet Take 50 mg by mouth as needed for  erectile dysfunction. 02/11/21   [provider]    Family History Family History  Problem Relation Age of Onset   Colon cancer Neg Hx     Social History Social History[1]   Allergies   Quinolones   Review of Systems Review of Systems Per HPI  Physical Exam Triage Vital Signs ED Triage Vitals  Encounter Vitals Group     BP 02/24/24 1210 128/78     Girls Systolic BP Percentile --      Girls Diastolic BP Percentile --      Boys Systolic BP Percentile --      Boys Diastolic BP Percentile --      Pulse Rate 02/24/24 1210 98     Resp 02/24/24 1210 20     Temp 02/24/24 1210 98.7 F (37.1 C)     Temp Source 02/24/24 1210 Oral     SpO2 02/24/24 1210 93 %     Weight --      Height --      Head Circumference --      Peak Flow --      Pain Score 02/24/24 1213 0     Pain Loc --      Pain Education --      Exclude from Growth Chart --    No data found.  Updated Vital Signs BP 128/78 (BP Location: Right Arm)   Pulse 98   Temp 98.7 F (37.1 C) (Oral)   Resp 20   SpO2 93%   Visual Acuity Right Eye Distance:   Left Eye Distance:   Bilateral Distance:    Right Eye Near:   Left Eye Near:    Bilateral Near:     Physical Exam Vitals and nursing note reviewed.  Constitutional:      Appearance: He is well-developed.  HENT:     Head: Atraumatic.     Right Ear: External ear normal.     Left Ear: External ear normal.     Nose: Rhinorrhea present.     Mouth/Throat:     Pharynx: Posterior oropharyngeal erythema present. No oropharyngeal exudate.  Eyes:     Conjunctiva/sclera: Conjunctivae normal.     Pupils: Pupils are equal, round, and reactive to light.  Cardiovascular:     Rate and Rhythm: Normal rate and regular rhythm.  Pulmonary:     Effort: Pulmonary effort is normal. No respiratory distress.     Breath sounds: No wheezing or rales.  Musculoskeletal:        General: Normal range of motion.     Cervical back: Normal range of motion and neck  supple.  Lymphadenopathy:     Cervical: No cervical adenopathy.  Skin:    General: Skin is warm and dry.  Neurological:     Mental Status: He is alert and oriented to person, place, and time.  Psychiatric:        Behavior: Behavior normal.      UC Treatments / Results  Labs (all labs ordered are listed, but only abnormal results are displayed) Labs Reviewed - No data to display  EKG   Radiology No results found.  Procedures Procedures (including critical care time)  Medications Ordered in UC Medications - No data to display  Initial Impression / Assessment and Plan / UC Course  I have reviewed the triage vital signs and the nursing notes.  Pertinent labs & imaging results that were available during my care of the patient were reviewed by me and considered in my medical decision making (see chart for details).     Vitals and exam reassuring today, consistent with viral respiratory infection.  Treat with Astelin , Phenergan  DM, supportive over-the-counter medications and home care.  Return for worsening or unresolving symptoms.  Final Clinical Impressions(s) / UC Diagnoses   Final diagnoses:  Viral URI with cough   Discharge Instructions   None    ED Prescriptions     Medication Sig Dispense Auth. Provider   azelastine  (ASTELIN ) 0.1 % nasal spray Place 1 spray into both nostrils 2 (two) times daily. Use in each nostril as directed 30 mL Stuart Vernell Norris, PA-C   promethazine -dextromethorphan (PROMETHAZINE -DM) 6.25-15 MG/5ML syrup Take 5 mLs by mouth 4 (four) times daily as needed. 100 mL Stuart Vernell Norris, [place]      PDMP not reviewed this encounter.    [1]  Social History Tobacco Use   Smoking status: Never   Smokeless tobacco: Never  Vaping Use   Vaping status: Never Used  Substance Use Topics   Alcohol use: No   Drug use: No     Stuart Vernell Norris, PA-C 02/24/24 1311

## 2024-02-24 NOTE — ED Triage Notes (Signed)
 Pt reports cough and hoarseness x 2 days, feels like burning  in the back of his throat.

## 2024-03-06 ENCOUNTER — Other Ambulatory Visit: Payer: Self-pay | Admitting: Cardiovascular Disease
